# Patient Record
Sex: Female | Born: 1970
Health system: Southern US, Community
[De-identification: ages and names within clinical notes are randomized; demographics above are authoritative.]

## PROBLEM LIST (undated history)

## (undated) ENCOUNTER — Inpatient Hospital Stay (HOSPITAL_COMMUNITY): Payer: Self-pay

## (undated) DIAGNOSIS — B009 Herpesviral infection, unspecified: Secondary | ICD-10-CM

## (undated) DIAGNOSIS — K811 Chronic cholecystitis: Secondary | ICD-10-CM

## (undated) DIAGNOSIS — H15009 Unspecified scleritis, unspecified eye: Secondary | ICD-10-CM

## (undated) DIAGNOSIS — H209 Unspecified iridocyclitis: Secondary | ICD-10-CM

## (undated) DIAGNOSIS — H40009 Preglaucoma, unspecified, unspecified eye: Secondary | ICD-10-CM

## (undated) DIAGNOSIS — K219 Gastro-esophageal reflux disease without esophagitis: Secondary | ICD-10-CM

## (undated) DIAGNOSIS — O039 Complete or unspecified spontaneous abortion without complication: Secondary | ICD-10-CM

## (undated) HISTORY — DX: Unspecified iridocyclitis: H20.9

## (undated) HISTORY — PX: ANAL FISSURE REPAIR: SHX2312

## (undated) HISTORY — DX: Preglaucoma, unspecified, unspecified eye: H40.009

## (undated) HISTORY — DX: Unspecified scleritis, unspecified eye: H15.009

## (undated) HISTORY — DX: Complete or unspecified spontaneous abortion without complication: O03.9

---

## 2011-02-02 ENCOUNTER — Ambulatory Visit
Admission: RE | Admit: 2011-02-02 | Discharge: 2011-02-02 | Disposition: A | Discharge status: Home / Self Care | Payer: BC Managed Care – PPO | Source: Ambulatory Visit | Attending: Rheumatology | Admitting: Rheumatology

## 2011-02-02 ENCOUNTER — Other Ambulatory Visit: Payer: Self-pay | Admitting: Rheumatology

## 2011-02-02 DIAGNOSIS — M545 Low back pain, unspecified: Secondary | ICD-10-CM

## 2011-10-04 ENCOUNTER — Emergency Department (HOSPITAL_COMMUNITY)
Admission: EM | Admit: 2011-10-04 | Discharge: 2011-10-04 | Disposition: A | Discharge status: Home / Self Care | Payer: BC Managed Care – PPO | Attending: Emergency Medicine | Admitting: Emergency Medicine

## 2011-10-04 ENCOUNTER — Emergency Department (HOSPITAL_COMMUNITY): Payer: BC Managed Care – PPO

## 2011-10-04 DIAGNOSIS — K5909 Other constipation: Secondary | ICD-10-CM | POA: Insufficient documentation

## 2011-10-04 DIAGNOSIS — R109 Unspecified abdominal pain: Secondary | ICD-10-CM | POA: Insufficient documentation

## 2011-10-04 DIAGNOSIS — R10819 Abdominal tenderness, unspecified site: Secondary | ICD-10-CM | POA: Insufficient documentation

## 2011-10-04 LAB — CBC
HCT: 35.8 % — ABNORMAL LOW (ref 36.0–46.0)
Hemoglobin: 12.1 g/dL (ref 12.0–15.0)
MCH: 29.9 pg (ref 26.0–34.0)
MCHC: 33.8 g/dL (ref 30.0–36.0)
MCV: 88.4 fL (ref 78.0–100.0)
Platelets: 255 10*3/uL (ref 150–400)
RBC: 4.05 MIL/uL (ref 3.87–5.11)
RDW: 12.9 % (ref 11.5–15.5)
WBC: 7.7 10*3/uL (ref 4.0–10.5)

## 2011-10-04 LAB — URINALYSIS, ROUTINE W REFLEX MICROSCOPIC
Bilirubin Urine: NEGATIVE
Glucose, UA: NEGATIVE mg/dL
Ketones, ur: NEGATIVE mg/dL
Leukocytes, UA: NEGATIVE
Nitrite: NEGATIVE
Protein, ur: NEGATIVE mg/dL
Specific Gravity, Urine: 1.029 (ref 1.005–1.030)
Urobilinogen, UA: 0.2 mg/dL (ref 0.0–1.0)
pH: 5.5 (ref 5.0–8.0)

## 2011-10-04 LAB — DIFFERENTIAL
Basophils Absolute: 0 10*3/uL (ref 0.0–0.1)
Basophils Relative: 0 % (ref 0–1)
Eosinophils Absolute: 0.2 10*3/uL (ref 0.0–0.7)
Eosinophils Relative: 2 % (ref 0–5)
Lymphocytes Relative: 32 % (ref 12–46)
Lymphs Abs: 2.5 10*3/uL (ref 0.7–4.0)
Monocytes Absolute: 0.6 10*3/uL (ref 0.1–1.0)
Monocytes Relative: 8 % (ref 3–12)
Neutro Abs: 4.5 10*3/uL (ref 1.7–7.7)
Neutrophils Relative %: 58 % (ref 43–77)

## 2011-10-04 LAB — POCT PREGNANCY, URINE: Preg Test, Ur: NEGATIVE

## 2011-10-04 LAB — URINE MICROSCOPIC-ADD ON

## 2011-10-04 LAB — LIPASE, BLOOD: Lipase: 29 U/L (ref 11–59)

## 2011-10-04 LAB — COMPREHENSIVE METABOLIC PANEL
ALT: 11 U/L (ref 0–35)
AST: 14 U/L (ref 0–37)
Albumin: 3.9 g/dL (ref 3.5–5.2)
Alkaline Phosphatase: 51 U/L (ref 39–117)
BUN: 20 mg/dL (ref 6–23)
CO2: 24 mEq/L (ref 19–32)
Calcium: 9.3 mg/dL (ref 8.4–10.5)
Chloride: 104 mEq/L (ref 96–112)
Creatinine, Ser: 0.73 mg/dL (ref 0.50–1.10)
GFR calc Af Amer: >90 mL/min (ref >90)
GFR calc non Af Amer: >90 mL/min (ref >90)
Glucose, Bld: 95 mg/dL (ref 70–99)
Potassium: 3.7 mEq/L (ref 3.5–5.1)
Sodium: 139 mEq/L (ref 135–145)
Total Bilirubin: 0.4 mg/dL (ref 0.3–1.2)
Total Protein: 7.5 g/dL (ref 6.0–8.3)

## 2011-10-04 MED ORDER — ONDANSETRON HCL 4 MG/2ML IJ SOLN
4.0000 mg | Freq: Once | INTRAMUSCULAR | Status: AC
  Administered 2011-10-04: 4 mg via INTRAMUSCULAR
  Filled 2011-10-04: qty 2

## 2011-10-04 MED ORDER — OXYCODONE-ACETAMINOPHEN 5-325 MG PO TABS: 1.0000 | ORAL_TABLET | ORAL | Status: AC | PRN

## 2011-10-04 MED ORDER — SODIUM CHLORIDE 0.9 % IV SOLN
Freq: Once | INTRAVENOUS | Status: AC
  Administered 2011-10-04: 08:00:00 via INTRAVENOUS

## 2011-10-04 MED ORDER — METOCLOPRAMIDE HCL 10 MG PO TABS
10.0000 mg | ORAL_TABLET | Freq: Four times a day (QID) | ORAL | Status: AC | PRN
Start: 2011-10-04 — End: 2011-10-14

## 2011-10-04 MED ORDER — HYDROMORPHONE HCL PF 1 MG/ML IJ SOLN
1.0000 mg | Freq: Once | INTRAMUSCULAR | Status: AC
  Administered 2011-10-04: 1 mg via INTRAVENOUS
  Filled 2011-10-04: qty 1

## 2011-10-04 NOTE — ED Notes (Signed)
Report received, assumed care.  

## 2011-10-04 NOTE — ED Provider Notes (Addendum)
History     CSN: [id number] Arrival date & time: 10/04/2011  7:08 AM   First MD Initiated Contact with Patient 10/04/11 [phone]      Chief Complaint  Patient presents with  . Abdominal Pain    (Consider location/radiation/quality/duration/timing/severity/associated sxs/prior treatment) The history is provided by the patient.   40 year old female had onset last night of pain which started in her upper lumbar area bilaterally and then settled into her right flank area radiating around to the right upper quadrant. Pain is worse when she bends over but nothing makes it better. Pain is severe and comes in waves.she describes the pain as sharp. The pain is 5/10 currently but 10 out of 10 at its worst. She denies nausea or vomiting and denies diarrhea. She has chronic problems with constipation. She denies any urinary symptoms. There has been no fever and no chills or sweats. She states that 2 years ago she had an ultrasound which showed gallstones but she has not had any abdominal pain since then.she states she has never been pregnant. No past medical history on file.  No past surgical history on file.  No family history on file.  History  Substance Use Topics  . Smoking status: Never Smoker   . Smokeless tobacco: Not on file  . Alcohol Use: No    OB History    Grav Para Term Preterm Abortions TAB SAB Ect Mult Living                  Review of Systems  All other systems reviewed and are negative.    Allergies  Review of patient's allergies indicates no known allergies.  Home Medications  No current outpatient prescriptions on file.  BP 115/78  Pulse 88  Temp(Src) 97.9 F (36.6 C) (Oral)  Resp 18  Ht 5\' 2"  (1.575 m)  Wt 147 lb (66.679 kg)  BMI 26.89 kg/m2  SpO2 98%  LMP 10/03/2011  Physical Exam  Nursing note and vitals reviewed.  40 year old female who appears to be in pain. Vital signs are normal. Head is normocephalic and atraumatic. PERRLA, EOMI. The sclerae  are anicteric. Oropharynx is clear. Neck is supple without adenopathy or JVD. Back is nontender along the spine, but there is mild right CVA tenderness. Lungs are clear without any rales, wheezes, rhonchi. Heart has regular rate and rhythm without murmur. Abdomen is soft with mild tenderness in epigastrium and marked tenderness in the right upper quadrant with a positive Murphy sign. There is no hepatosplenomegaly. Peristalsis is diminished. Extremities have full range of motion, no cyanosis or edema. Skin is warm and moist without rash. Neurologic: Mental status is normal, cranial nerves are intact. There are no motor or sensory deficits. Psychiatric: No abnormalities of mood or affect. ED Course  Procedures (including critical care time)   Labs Reviewed  CBC  DIFFERENTIAL  COMPREHENSIVE METABOLIC PANEL  LIPASE, BLOOD  URINALYSIS, ROUTINE W REFLEX MICROSCOPIC  POCT PREGNANCY, URINE   No results found.  Results for orders placed during the hospital encounter of 10/04/11  CBC      Component Value Range   WBC 7.7  4.0 - 10.5 (K/uL)   RBC 4.05  3.87 - 5.11 (MIL/uL)   Hemoglobin 12.1  12.0 - 15.0 (g/dL)   HCT 59.5 (*) 63.8 - 46.0 (%)   MCV 88.4  78.0 - 100.0 (fL)   MCH 29.9  26.0 - 34.0 (pg)   MCHC 33.8  30.0 - 36.0 (g/dL)   RDW 12.9  11.5 - 15.5 (%)   Platelets 255  150 - 400 (K/uL)  DIFFERENTIAL      Component Value Range   Neutrophils Relative 58  43 - 77 (%)   Neutro Abs 4.5  1.7 - 7.7 (K/uL)   Lymphocytes Relative 32  12 - 46 (%)   Lymphs Abs 2.5  0.7 - 4.0 (K/uL)   Monocytes Relative 8  3 - 12 (%)   Monocytes Absolute 0.6  0.1 - 1.0 (K/uL)   Eosinophils Relative 2  0 - 5 (%)   Eosinophils Absolute 0.2  0.0 - 0.7 (K/uL)   Basophils Relative 0  0 - 1 (%)   Basophils Absolute 0.0  0.0 - 0.1 (K/uL)  COMPREHENSIVE METABOLIC PANEL      Component Value Range   Sodium 139  135 - 145 (mEq/L)   Potassium 3.7  3.5 - 5.1 (mEq/L)   Chloride 104  96 - 112 (mEq/L)   CO2 24  19 - 32  (mEq/L)   Glucose, Bld 95  70 - 99 (mg/dL)   BUN 20  6 - 23 (mg/dL)   Creatinine, Ser 4.09  0.50 - 1.10 (mg/dL)   Calcium 9.3  8.4 - 81.1 (mg/dL)   Total Protein 7.5  6.0 - 8.3 (g/dL)   Albumin 3.9  3.5 - 5.2 (g/dL)   AST 14  0 - 37 (U/L)   ALT 11  0 - 35 (U/L)   Alkaline Phosphatase 51  39 - 117 (U/L)   Total Bilirubin 0.4  0.3 - 1.2 (mg/dL)   GFR calc non Af Amer >90  >90 (mL/min)   GFR calc Af Amer >90  >90 (mL/min)  LIPASE, BLOOD      Component Value Range   Lipase 29  11 - 59 (U/L)  URINALYSIS, ROUTINE W REFLEX MICROSCOPIC      Component Value Range   Color, Urine YELLOW  YELLOW    APPearance CLOUDY (*) CLEAR    Specific Gravity, Urine 1.029  1.005 - 1.030    pH 5.5  5.0 - 8.0    Glucose, UA NEGATIVE  NEGATIVE (mg/dL)   Hgb urine dipstick LARGE (*) NEGATIVE    Bilirubin Urine NEGATIVE  NEGATIVE    Ketones, ur NEGATIVE  NEGATIVE (mg/dL)   Protein, ur NEGATIVE  NEGATIVE (mg/dL)   Urobilinogen, UA 0.2  0.0 - 1.0 (mg/dL)   Nitrite NEGATIVE  NEGATIVE    Leukocytes, UA NEGATIVE  NEGATIVE   POCT PREGNANCY, URINE      Component Value Range   Preg Test, Ur NEGATIVE    URINE MICROSCOPIC-ADD ON      Component Value Range   Squamous Epithelial / LPF FEW (*) RARE    WBC, UA 0-2  <3 (WBC/hpf)   RBC / HPF 7-10  <3 (RBC/hpf)   Bacteria, UA RARE  RARE    Crystals CA OXALATE CRYSTALS (*) NEGATIVE    US Abdomen Complete  10/04/2011  *RADIOLOGY REPORT*  Clinical Data:  Abdominal pain.  History of gallstones.  COMPLETE ABDOMINAL ULTRASOUND  Comparison:  None.  Findings:  Gallbladder:  The gallbladder is normal in appearance, without wall thickening or pericholecystic fluid.  The technologist describes tenderness with gallbladder palpation.  Common bile duct: Normal, 5 mm.  Liver: Normal in echogenicity, without focal lesion.  IVC: Negative  Pancreas:  Negative  Spleen:  Normal in size and echogenicity.  Right Kidney:  12.0 cm. No hydronephrosis.  Left Kidney:  11.6 cm. No hydronephrosis.  Abdominal aorta:  Nonaneurysmal without ascites.  IMPRESSION:  1.  The technologist describes tenderness with gallbladder palpation.  The gallbladder is normal in appearance, arguing against acute cholecystitis. 2.  Otherwise normal abdominal ultrasound.  Original Report Authenticated By: Consuello Bossier, M.D.     No diagnosis found. She was given Dilaudid and Zofran with excellent relief of pain. Workup is essentially negative although there is a mention on ultrasound report that pain was elicited when the probe pressed against the gallbladder. I discussed with the patient the findings. The next up and evaluation would be a HIDA scan, which can be obtained as an outpatient.  Case has been discussed with [facility] who will see her for outpatient evaluation.  MDM  Right upper quadrant pain-most likely biliary colic.        Dione Booze, MD 10/04/11 1610  Dione Booze, MD 10/04/11 [phone]

## 2011-10-04 NOTE — ED Notes (Signed)
Patient transported to Ultrasound 

## 2011-10-04 NOTE — ED Notes (Signed)
Returned from Ultrasound. Tolerated well. Informed patient and/or family of status.

## 2011-10-06 ENCOUNTER — Encounter: Payer: Self-pay | Admitting: Internal Medicine

## 2011-10-19 NOTE — L&D Delivery Note (Signed)
Delivery Note At 8:30 PM a healthy female was delivered via Vaginal, Spontaneous Delivery (Presentation:  LOA).  APGAR: 7, 9; weight 9 lb 4.3 oz (4205 g).   Placenta status: Intact, Spontaneous.  Cord: 3 vessels with the following complications: .  Cord pH: Not done  Anesthesia: Epidural  Episiotomy: None Lacerations: 2nd degree Suture Repair: vicryl rapide Est. Blood Loss (mL): 350  Mom to postpartum.  Baby to nursery-stable.  Renee [name],MARIE-LYNE 08/09/2012, 9:27 PM

## 2011-10-29 ENCOUNTER — Ambulatory Visit: Payer: BC Managed Care – PPO | Admitting: Internal Medicine

## 2012-01-04 LAB — OB RESULTS CONSOLE ABO/RH: RH Type: POSITIVE

## 2012-01-04 LAB — OB RESULTS CONSOLE HIV ANTIBODY (ROUTINE TESTING): HIV: NONREACTIVE

## 2012-01-04 LAB — OB RESULTS CONSOLE HEPATITIS B SURFACE ANTIGEN: Hepatitis B Surface Ag: NEGATIVE

## 2012-01-04 LAB — OB RESULTS CONSOLE RPR: RPR: NONREACTIVE

## 2012-01-04 LAB — OB RESULTS CONSOLE GC/CHLAMYDIA
Chlamydia: NEGATIVE
Gonorrhea: NEGATIVE

## 2012-01-04 LAB — OB RESULTS CONSOLE ANTIBODY SCREEN: Antibody Screen: NEGATIVE

## 2012-01-04 LAB — OB RESULTS CONSOLE RUBELLA ANTIBODY, IGM: Rubella: IMMUNE

## 2012-06-26 LAB — OB RESULTS CONSOLE GBS: GBS: POSITIVE

## 2012-07-29 ENCOUNTER — Inpatient Hospital Stay (HOSPITAL_COMMUNITY)
Admission: AD | Admit: 2012-07-29 | Discharge: 2012-07-29 | Disposition: A | Discharge status: Home / Self Care | Payer: BC Managed Care – PPO | Source: Ambulatory Visit | Attending: Obstetrics & Gynecology | Admitting: Obstetrics & Gynecology

## 2012-07-29 ENCOUNTER — Encounter (HOSPITAL_COMMUNITY): Payer: Self-pay | Admitting: *Deleted

## 2012-07-29 DIAGNOSIS — O99891 Other specified diseases and conditions complicating pregnancy: Secondary | ICD-10-CM | POA: Insufficient documentation

## 2012-07-29 HISTORY — DX: Herpesviral infection, unspecified: B00.9

## 2012-07-29 LAB — AMNISURE RUPTURE OF MEMBRANE (ROM) NOT AT ARMC: Amnisure ROM: NEGATIVE

## 2012-07-29 LAB — POCT FERN TEST: Fern Test: NEGATIVE

## 2012-07-29 NOTE — MAU Note (Signed)
Leaked a small amount of fluid around 2am but not leaking currently. No contractions

## 2012-08-02 ENCOUNTER — Telehealth (HOSPITAL_COMMUNITY): Payer: Self-pay | Admitting: *Deleted

## 2012-08-02 ENCOUNTER — Encounter (HOSPITAL_COMMUNITY): Payer: Self-pay | Admitting: *Deleted

## 2012-08-02 NOTE — Telephone Encounter (Signed)
Preadmission screen  

## 2012-08-04 ENCOUNTER — Other Ambulatory Visit: Payer: Self-pay | Admitting: Obstetrics & Gynecology

## 2012-08-08 ENCOUNTER — Other Ambulatory Visit: Payer: Self-pay | Admitting: Obstetrics & Gynecology

## 2012-08-08 ENCOUNTER — Encounter (HOSPITAL_COMMUNITY): Payer: Self-pay

## 2012-08-08 ENCOUNTER — Inpatient Hospital Stay (HOSPITAL_COMMUNITY)
Admission: RE | Admit: 2012-08-08 | Discharge: 2012-08-11 | DRG: 373 | Disposition: A | Discharge status: Home / Self Care | Payer: BC Managed Care – PPO | Source: Ambulatory Visit | Attending: Obstetrics | Admitting: Obstetrics

## 2012-08-08 ENCOUNTER — Telehealth (HOSPITAL_COMMUNITY): Payer: Self-pay | Admitting: *Deleted

## 2012-08-08 DIAGNOSIS — O09529 Supervision of elderly multigravida, unspecified trimester: Secondary | ICD-10-CM | POA: Diagnosis present

## 2012-08-08 DIAGNOSIS — O480 Post-term pregnancy: Principal | ICD-10-CM | POA: Diagnosis present

## 2012-08-08 DIAGNOSIS — O358XX0 Maternal care for other (suspected) fetal abnormality and damage, not applicable or unspecified: Secondary | ICD-10-CM | POA: Diagnosis present

## 2012-08-08 LAB — CBC
HCT: 36.2 % (ref 36.0–46.0)
Hemoglobin: 12.1 g/dL (ref 12.0–15.0)
MCH: 31 pg (ref 26.0–34.0)
MCHC: 33.4 g/dL (ref 30.0–36.0)
MCV: 92.8 fL (ref 78.0–100.0)
Platelets: 304 10*3/uL (ref 150–400)
RBC: 3.9 MIL/uL (ref 3.87–5.11)
RDW: 14.6 % (ref 11.5–15.5)
WBC: 10.7 10*3/uL — ABNORMAL HIGH (ref 4.0–10.5)

## 2012-08-08 MED ORDER — OXYTOCIN 40 UNITS IN LACTATED RINGERS INFUSION - SIMPLE MED: 62.5000 mL/h | INTRAVENOUS | Status: DC

## 2012-08-08 MED ORDER — TERBUTALINE SULFATE 1 MG/ML IJ SOLN: 0.2500 mg | Freq: Once | INTRAMUSCULAR | Status: AC | PRN

## 2012-08-08 MED ORDER — ZOLPIDEM TARTRATE 5 MG PO TABS
5.0000 mg | ORAL_TABLET | Freq: Every evening | ORAL | Status: DC | PRN
  Administered 2012-08-08: 5 mg via ORAL
  Filled 2012-08-08: qty 1

## 2012-08-08 MED ORDER — ACETAMINOPHEN 325 MG PO TABS: 650.0000 mg | ORAL_TABLET | ORAL | Status: DC | PRN

## 2012-08-08 MED ORDER — IBUPROFEN 600 MG PO TABS: 600.0000 mg | ORAL_TABLET | Freq: Four times a day (QID) | ORAL | Status: DC | PRN

## 2012-08-08 MED ORDER — OXYTOCIN 40 UNITS IN LACTATED RINGERS INFUSION - SIMPLE MED
1.0000 m[IU]/min | INTRAVENOUS | Status: DC
  Administered 2012-08-09: 1 m[IU]/min via INTRAVENOUS
  Filled 2012-08-08: qty 1000

## 2012-08-08 MED ORDER — ONDANSETRON HCL 4 MG/2ML IJ SOLN: 4.0000 mg | Freq: Four times a day (QID) | INTRAMUSCULAR | Status: DC | PRN

## 2012-08-08 MED ORDER — MISOPROSTOL 25 MCG QUARTER TABLET
25.0000 ug | ORAL_TABLET | ORAL | Status: DC | PRN
  Administered 2012-08-08 – 2012-08-09 (×2): 25 ug via VAGINAL
  Filled 2012-08-08 (×2): qty 0.25

## 2012-08-08 MED ORDER — LACTATED RINGERS IV SOLN
500.0000 mL | INTRAVENOUS | Status: DC | PRN
  Administered 2012-08-09: 1000 mL via INTRAVENOUS
  Administered 2012-08-09 (×2): 500 mL via INTRAVENOUS

## 2012-08-08 MED ORDER — PENICILLIN G POTASSIUM 5000000 UNITS IJ SOLR
5.0000 10*6.[IU] | Freq: Once | INTRAVENOUS | Status: AC
  Administered 2012-08-09: 5 10*6.[IU] via INTRAVENOUS
  Filled 2012-08-08: qty 5

## 2012-08-08 MED ORDER — LACTATED RINGERS IV SOLN
INTRAVENOUS | Status: DC
  Administered 2012-08-08: 21:00:00 via INTRAVENOUS

## 2012-08-08 MED ORDER — OXYTOCIN BOLUS FROM INFUSION
500.0000 mL | INTRAVENOUS | Status: DC
  Administered 2012-08-09: 500 mL via INTRAVENOUS
  Filled 2012-08-08 (×83): qty 500

## 2012-08-08 MED ORDER — PENICILLIN G POTASSIUM 5000000 UNITS IJ SOLR
2.5000 10*6.[IU] | INTRAMUSCULAR | Status: DC
  Administered 2012-08-09 (×3): 2.5 10*6.[IU] via INTRAVENOUS
  Filled 2012-08-08 (×5): qty 2.5

## 2012-08-08 MED ORDER — LIDOCAINE HCL (PF) 1 % IJ SOLN
30.0000 mL | INTRAMUSCULAR | Status: DC | PRN
  Administered 2012-08-09: 30 mL via SUBCUTANEOUS
  Filled 2012-08-08: qty 30

## 2012-08-08 MED ORDER — OXYCODONE-ACETAMINOPHEN 5-325 MG PO TABS: 1.0000 | ORAL_TABLET | ORAL | Status: DC | PRN

## 2012-08-08 MED ORDER — CITRIC ACID-SODIUM CITRATE 334-500 MG/5ML PO SOLN: 30.0000 mL | ORAL | Status: DC | PRN

## 2012-08-08 NOTE — Telephone Encounter (Signed)
Preadmission screen  

## 2012-08-09 ENCOUNTER — Inpatient Hospital Stay (HOSPITAL_COMMUNITY)
Admission: RE | Admit: 2012-08-09 | Payer: BC Managed Care – PPO | Source: Ambulatory Visit | Admitting: Obstetrics & Gynecology

## 2012-08-09 ENCOUNTER — Encounter (HOSPITAL_COMMUNITY): Payer: Self-pay

## 2012-08-09 ENCOUNTER — Encounter (HOSPITAL_COMMUNITY): Payer: Self-pay | Admitting: Anesthesiology

## 2012-08-09 ENCOUNTER — Inpatient Hospital Stay (HOSPITAL_COMMUNITY): Payer: BC Managed Care – PPO | Admitting: Anesthesiology

## 2012-08-09 LAB — RPR: RPR Ser Ql: NONREACTIVE

## 2012-08-09 LAB — PREPARE RBC (CROSSMATCH)

## 2012-08-09 LAB — ABO/RH: ABO/RH(D): AB POS

## 2012-08-09 MED ORDER — FENTANYL 2.5 MCG/ML BUPIVACAINE 1/10 % EPIDURAL INFUSION (WH - ANES)
INTRAMUSCULAR | Status: DC | PRN
  Administered 2012-08-09: 14 mL/h via EPIDURAL

## 2012-08-09 MED ORDER — PHENYLEPHRINE 40 MCG/ML (10ML) SYRINGE FOR IV PUSH (FOR BLOOD PRESSURE SUPPORT)
80.0000 ug | PREFILLED_SYRINGE | INTRAVENOUS | Status: DC | PRN
  Filled 2012-08-09: qty 5

## 2012-08-09 MED ORDER — SODIUM BICARBONATE 8.4 % IV SOLN
INTRAVENOUS | Status: DC | PRN
  Administered 2012-08-09: 5 mL via EPIDURAL

## 2012-08-09 MED ORDER — EPHEDRINE 5 MG/ML INJ
10.0000 mg | INTRAVENOUS | Status: DC | PRN
  Administered 2012-08-09: 10 mg via INTRAVENOUS
  Filled 2012-08-09: qty 4

## 2012-08-09 MED ORDER — ONDANSETRON HCL 4 MG PO TABS: 4.0000 mg | ORAL_TABLET | ORAL | Status: DC | PRN

## 2012-08-09 MED ORDER — EPHEDRINE 5 MG/ML INJ: 10.0000 mg | INTRAVENOUS | Status: DC | PRN

## 2012-08-09 MED ORDER — SENNOSIDES-DOCUSATE SODIUM 8.6-50 MG PO TABS
2.0000 | ORAL_TABLET | Freq: Every day | ORAL | Status: DC
  Administered 2012-08-09 – 2012-08-10 (×2): 2 via ORAL

## 2012-08-09 MED ORDER — DIPHENHYDRAMINE HCL 25 MG PO CAPS: 25.0000 mg | ORAL_CAPSULE | Freq: Four times a day (QID) | ORAL | Status: DC | PRN

## 2012-08-09 MED ORDER — LANOLIN HYDROUS EX OINT: TOPICAL_OINTMENT | CUTANEOUS | Status: DC | PRN

## 2012-08-09 MED ORDER — PHENYLEPHRINE 40 MCG/ML (10ML) SYRINGE FOR IV PUSH (FOR BLOOD PRESSURE SUPPORT): 80.0000 ug | PREFILLED_SYRINGE | INTRAVENOUS | Status: DC | PRN

## 2012-08-09 MED ORDER — WITCH HAZEL-GLYCERIN EX PADS: 1.0000 "application " | MEDICATED_PAD | CUTANEOUS | Status: DC | PRN

## 2012-08-09 MED ORDER — ZOLPIDEM TARTRATE 5 MG PO TABS: 5.0000 mg | ORAL_TABLET | Freq: Every evening | ORAL | Status: DC | PRN

## 2012-08-09 MED ORDER — PRENATAL MULTIVITAMIN CH
1.0000 | ORAL_TABLET | Freq: Every day | ORAL | Status: DC
  Administered 2012-08-10: 1 via ORAL
  Filled 2012-08-09 (×3): qty 1

## 2012-08-09 MED ORDER — FENTANYL 2.5 MCG/ML BUPIVACAINE 1/10 % EPIDURAL INFUSION (WH - ANES)
14.0000 mL/h | INTRAMUSCULAR | Status: DC
  Administered 2012-08-09: 14 mL/h via EPIDURAL
  Filled 2012-08-09 (×2): qty 125

## 2012-08-09 MED ORDER — BENZOCAINE-MENTHOL 20-0.5 % EX AERO
1.0000 "application " | INHALATION_SPRAY | CUTANEOUS | Status: DC | PRN
  Administered 2012-08-09: 1 via TOPICAL
  Filled 2012-08-09 (×2): qty 56

## 2012-08-09 MED ORDER — CITRIC ACID-SODIUM CITRATE 334-500 MG/5ML PO SOLN
ORAL | Status: AC
  Filled 2012-08-09: qty 15

## 2012-08-09 MED ORDER — TETANUS-DIPHTH-ACELL PERTUSSIS 5-2.5-18.5 LF-MCG/0.5 IM SUSP
0.5000 mL | Freq: Once | INTRAMUSCULAR | Status: AC
  Administered 2012-08-10: 0.5 mL via INTRAMUSCULAR
  Filled 2012-08-09: qty 0.5

## 2012-08-09 MED ORDER — DIBUCAINE 1 % RE OINT: 1.0000 "application " | TOPICAL_OINTMENT | RECTAL | Status: DC | PRN

## 2012-08-09 MED ORDER — ONDANSETRON HCL 4 MG/2ML IJ SOLN: 4.0000 mg | INTRAMUSCULAR | Status: DC | PRN

## 2012-08-09 MED ORDER — LACTATED RINGERS IV SOLN
500.0000 mL | Freq: Once | INTRAVENOUS | Status: AC
  Administered 2012-08-09: 1000 mL via INTRAVENOUS

## 2012-08-09 MED ORDER — DIPHENHYDRAMINE HCL 50 MG/ML IJ SOLN: 12.5000 mg | INTRAMUSCULAR | Status: DC | PRN

## 2012-08-09 MED ORDER — IBUPROFEN 600 MG PO TABS
600.0000 mg | ORAL_TABLET | Freq: Four times a day (QID) | ORAL | Status: DC
  Administered 2012-08-09 – 2012-08-11 (×7): 600 mg via ORAL
  Filled 2012-08-09 (×8): qty 1

## 2012-08-09 MED ORDER — OXYCODONE-ACETAMINOPHEN 5-325 MG PO TABS: 1.0000 | ORAL_TABLET | ORAL | Status: DC | PRN

## 2012-08-09 MED ORDER — SIMETHICONE 80 MG PO CHEW: 80.0000 mg | CHEWABLE_TABLET | ORAL | Status: DC | PRN

## 2012-08-09 NOTE — Anesthesia Procedure Notes (Signed)

## 2012-08-09 NOTE — Progress Notes (Signed)
Subjective: Doing well, pain vaginally, UCs MVU 180-200  Anesthesia epidural   Objective: BP 105/60  Pulse 106  Temp 98.2 F (36.8 C) (Oral)  Resp 18  Ht 5\' 3"  (1.6 m)  Wt 93.895 kg (207 lb)  BMI 36.67 kg/m2  SpO2 99%  LMP 10/25/2011   FHT:  FHR: 155 bpm, variability: moderate,  accelerations:  Present,  decelerations:  Present mild variable decelerations occasionally. UC:   regular, every 3 minutes VE:   Dilation: 9 Effacement (%): 100 Station: 0 Exam by:: Raistlin Gum   Assessment / Plan: Induction of labor for postterm.  Slow progression, possible FPD.  Adequate MVU x about 1 hr.  Following closely.  Possibility of requiring a C/S delivery discussed.  Fetal Wellbeing:  Category I Pain Control:  Epidural  Anticipated MOD:  NSVD or C/S per next evaluation  Britton Perkinson,MARIE-LYNE 08/09/2012, 6:30 PM

## 2012-08-09 NOTE — Progress Notes (Signed)
Subjective: Doing well, pain/pressure vaginally, UCs q3 min  Anesthesia epidural   Objective: BP 98/69  Pulse 112  Temp 98.5 F (36.9 C) (Oral)  Resp 16  Ht 5\' 3"  (1.6 m)  Wt 93.895 kg (207 lb)  BMI 36.67 kg/m2  SpO2 99%  LMP 10/25/2011   FHT:  FHR: 145 bpm, variability: moderate,  accelerations:  Present,  decelerations:  Present Early non repetitive decelerations UC:   regular, every 3 minutes VE:   Dilation: 8 Effacement (%): 100 Station: 0 Exam by:: Piya Mesch   Assessment / Plan: Induction of labor due to postterm,  progressing well on pitocin  Fetal Wellbeing:  Category I Pain Control:  Epidural  Anticipated MOD:  NSVD  Nadyne Gariepy,MARIE-LYNE 08/09/2012, 1:25 PM

## 2012-08-09 NOTE — Anesthesia Preprocedure Evaluation (Signed)
Anesthesia Evaluation  Patient identified by MRN, date of birth, ID band Patient awake    Reviewed: Allergy & Precautions, H&P , Patient's Chart, lab work & pertinent test results  Airway Mallampati: II TM Distance: >3 FB Neck ROM: full    Dental  (+) Teeth Intact   Pulmonary  breath sounds clear to auscultation        Cardiovascular Rhythm:regular Rate:Normal     Neuro/Psych    GI/Hepatic   Endo/Other    Renal/GU      Musculoskeletal   Abdominal   Peds  Hematology   Anesthesia Other Findings       Reproductive/Obstetrics (+) Pregnancy                           Anesthesia Physical Anesthesia Plan  ASA: III  Anesthesia Plan: Epidural   Post-op Pain Management:    Induction:   Airway Management Planned:   Additional Equipment:   Intra-op Plan:   Post-operative Plan:   Informed Consent:   Plan Discussed with:   Anesthesia Plan Comments:         Anesthesia Quick Evaluation

## 2012-08-09 NOTE — H&P (Signed)
Subjective:  Renee [name] is a 41 y.o. G1 P0 female at 29 and 2/[redacted] weeks gestation who is being admitted for induction of labor.  Her current obstetrical history is significant for GBS colonizer.  Patient reports no complaints.   Fetal Movement: normal.     Objective:   Vital signs in last 24 hours: Temp:  [97.3 F (36.3 C)-98.5 F (36.9 C)] 98.5 F (36.9 C) (10/23 0821) Pulse Rate:  [80-119] 99  (10/23 1201) Resp:  [16-20] 18  (10/23 1201) BP: (75-129)/(32-81) 118/78 mmHg (10/23 1201) SpO2:  [96 %-99 %] 99 % (10/23 0822) Weight:  [93.895 kg (207 lb)] 93.895 kg (207 lb) (10/22 2010)   General:   alert  Skin:   normal  HEENT:  PERRLA  Lungs:   clear to auscultation bilaterally  Heart:   regular rate and rhythm, S1, S2 normal, no murmur, click, rub or gallop  Breasts:   normal without suspicious masses, skin or nipple changes or axillary nodes  Abdomen:  soft, non-tender; bowel sounds normal; no masses,  no organomegaly  Pelvis:  Exam deferred.  FHT:  140's BPM  Uterine Size: size greater than dates  Presentations: cephalic  Cervix:    Dilation: 3cm+   Effacement: 90%   Station:  -2   Consistency: soft   Position: anterior                                                             AROM fluid meco tinted AF                               UCs q29min well under epidural.  FHR good variability with internal monitoring.  Non repetitive early decelerations, rarely lates.  Lab Review  Rh+  Ultrascreen wnl.  AFP1 wnl.  [place] anato wnl.  Last [place] EFW 88%, AFI wnl.  One hour GTT: Normal    Assessment/Plan:  41 and 2/[redacted] weeks gestation. Active phase labor after Cytotec/Pitocin/AROM induction.  Fetal well-being reassuring. Obstetrical history significant for GBS pos and post term    Risks, benefits, alternatives and possible complications have been discussed in detail with the patient.  Pre-admission, admission, and post admission procedures and expectations were discussed in detail.  All  questions answered, all appropriate consents will be signed at the Hospital. Admission is planned for today.  Continue present management.

## 2012-08-10 LAB — CBC
HCT: 33.8 % — ABNORMAL LOW (ref 36.0–46.0)
Hemoglobin: 11.2 g/dL — ABNORMAL LOW (ref 12.0–15.0)
MCH: 30.9 pg (ref 26.0–34.0)
MCHC: 33.1 g/dL (ref 30.0–36.0)
MCV: 93.1 fL (ref 78.0–100.0)
Platelets: 288 10*3/uL (ref 150–400)
RBC: 3.63 MIL/uL — ABNORMAL LOW (ref 3.87–5.11)
RDW: 14.9 % (ref 11.5–15.5)
WBC: 22.4 10*3/uL — ABNORMAL HIGH (ref 4.0–10.5)

## 2012-08-10 NOTE — Progress Notes (Signed)
PPD 1 SVD  S:  Reports feeling well             Tolerating po/ No nausea or vomiting             Bleeding is light             Pain controlled withibuprofen (OTC)             Up ad lib / ambulatory  Newborn breast-feeding  / newborn with fractured clavicle   O:               VS: BP 97/62  Pulse 87  Temp 97.5 F (36.4 C) (Oral)  Resp 16  Ht 5\' 3"  (1.6 m)  Wt 93.895 kg (207 lb)  BMI 36.67 kg/m2  SpO2 99%  LMP 10/25/2011  Breastfeeding? Unknown   LABS:  Basename 08/10/12 0540 08/08/12 2040  WBC 22.4* 10.7*  HGB 11.2* 12.1  PLT 288 304                                       Physical Exam:             Alert and oriented X3  Lungs: Clear and unlabored  Heart: regular rate and rhythm / no mumurs  Abdomen: soft, non-tender, non-distended              Fundus: firm, non-tender, U-1  Perineum: mild edema / ice pack in place  Lochia: light  Extremities: trace edema, no calf pain or tenderness    A: PPD # 1   Doing well - stable status             Neonatal clavicle fracture    P:  Routine post partum orders  discharge tomorrow   Answered patient questions about neonatal clavicle fracture - reassured will heal normally and not painful for babies unless arm is abducted laterally- pediatrician will discuss with her isolating arm until healed / no residual effects anticipated / not uncommon with very large babies delivered vaginally .  Marlinda Mike CNM, MSN 08/10/2012, 11:32 AM

## 2012-08-11 LAB — TYPE AND SCREEN
ABO/RH(D): AB POS
Antibody Screen: NEGATIVE
Unit division: 0
Unit division: 0

## 2012-08-11 MED ORDER — DOCUSATE SODIUM 100 MG PO CAPS: 100.0000 mg | ORAL_CAPSULE | Freq: Two times a day (BID) | ORAL | Status: AC | PRN

## 2012-08-11 MED ORDER — OXYCODONE-ACETAMINOPHEN 5-325 MG PO TABS: 1.0000 | ORAL_TABLET | Freq: Four times a day (QID) | ORAL | Status: DC | PRN

## 2012-08-11 MED ORDER — IBUPROFEN 600 MG PO TABS: 600.0000 mg | ORAL_TABLET | Freq: Four times a day (QID) | ORAL | Status: DC

## 2012-08-11 NOTE — Discharge Summary (Signed)
Obstetric Discharge Summary Reason for Admission: induction of labor and gestation @ 61w 2d Prenatal Procedures: ultrasound Intrapartum Procedures: spontaneous vaginal delivery Postpartum Procedures: none Complications-Operative and Postpartum: 2nd degree perineal laceration and Neonatal clavicle fracture Hemoglobin  Date Value Range Status  08/10/2012 11.2* 12.0 - 15.0 g/dL Final     HCT  Date Value Range Status  08/10/2012 33.8* 36.0 - 46.0 % Final    Physical Exam:  General: alert, cooperative and no distress Lochia: appropriate Uterine Fundus: firm Incision: na DVT Evaluation: No evidence of DVT seen on physical exam. Calf/Ankle edema is present. +3 pedal and pretib  Discharge Diagnoses: Term Pregnancy-delivered, w/ 2nd degree repair Neonatal clavicle fracture  Discharge Information: Date: 08/11/2012 Activity: pelvic rest Diet: routine Medications: PNV, Ibuprofen, Colace and Percocet Condition: stable Instructions: refer to practice specific booklet Discharge to: home   Newborn Data: Live born female on 08/09/12 Birth Weight: 9 lb 4.3 oz (4205 g) APGAR: 7, 9  Home with mother.  Teigan Manner K 08/11/2012, 9:57 AM

## 2012-08-11 NOTE — Progress Notes (Signed)
Patient ID: Renee [name], female   DOB: 02/06/1971, 41 y.o.   MRN: [id number]  PPD # 2  Subjective: Pt reports feeling well and eager for d/c home.  More comfortable with infant status this am and notes improvement/ Pain controlled with ibuprofen Tolerating po/ Voiding without problems/ No n/v Bleeding is light/ Newborn info:  Information for the patient's newborn:  [name], [name] [[id number]]  female Feeding: breast    Objective:  VS: Blood pressure 96/61, pulse 84, temperature 98 F (36.7 C), temperature source Oral, resp. rate 20.    Basename 08/10/12 0540 08/08/12 2040  WBC 22.4* 10.7*  HGB 11.2* 12.1  HCT 33.8* 36.2  PLT 288 304    Blood type: --/--/AB POS, AB POS (10/22 2040) Rubella: Immune (03/19 0000)    Physical Exam:  General: A & O x 3  alert, cooperative and no distress CV: Regular rate and rhythm Resp: clear Abdomen: soft, nontender, normal bowel sounds Uterine Fundus: firm, below umbilicus, nontender Perineum: not inspected; breast feeding during assmt Lochia: minimal Ext: edema +3 pedal and pretib and Homans sign is negative, no sign of DVT    A/P: PPD # 2/ G1P1001/ S/P: SVD w/ 2nd degree repair Neonatal clavicle fracture, stable Doing well and stable for discharge home RX: Ibuprofen 600mg  po Q 6 hrs prn pain #30 Refill x 1 Percocet 5/325 1 to 2 po Q 4 hrs prn pain #15 No refill WOB/GYN booklet given Routine pp visit in 6wks   Demetrius Revel, MSN, [facility] 08/11/2012, 9:52 AM

## 2012-08-18 ENCOUNTER — Inpatient Hospital Stay (HOSPITAL_COMMUNITY): Admission: AD | Admit: 2012-08-18 | Payer: Self-pay | Source: Ambulatory Visit | Admitting: Obstetrics & Gynecology

## 2012-10-06 ENCOUNTER — Ambulatory Visit
Admission: RE | Admit: 2012-10-06 | Discharge: 2012-10-06 | Disposition: A | Discharge status: Home / Self Care | Payer: 59 | Source: Ambulatory Visit | Attending: Ophthalmology | Admitting: Ophthalmology

## 2012-10-06 ENCOUNTER — Other Ambulatory Visit: Payer: Self-pay | Admitting: Ophthalmology

## 2012-10-06 DIAGNOSIS — H15119 Episcleritis periodica fugax, unspecified eye: Secondary | ICD-10-CM

## 2013-01-08 DIAGNOSIS — IMO0002 Reserved for concepts with insufficient information to code with codable children: Secondary | ICD-10-CM | POA: Insufficient documentation

## 2013-01-16 HISTORY — PX: CATARACT EXTRACTION W/ INTRAOCULAR LENS IMPLANT: SHX1309

## 2013-02-02 DIAGNOSIS — Z9849 Cataract extraction status, unspecified eye: Secondary | ICD-10-CM | POA: Insufficient documentation

## 2013-08-15 ENCOUNTER — Encounter (HOSPITAL_BASED_OUTPATIENT_CLINIC_OR_DEPARTMENT_OTHER): Payer: Self-pay | Admitting: *Deleted

## 2013-08-17 ENCOUNTER — Encounter (HOSPITAL_BASED_OUTPATIENT_CLINIC_OR_DEPARTMENT_OTHER): Payer: Self-pay | Admitting: *Deleted

## 2013-08-17 NOTE — Progress Notes (Signed)
NPO AFTER MN. ARRIVE AT 0600.  PRE-OP ORDERS PENDING, CALLED OFFICE LM FOR DR LAVOIE.  OTHERWISE NEEDS HG.

## 2013-08-20 ENCOUNTER — Other Ambulatory Visit: Payer: Self-pay | Admitting: Obstetrics & Gynecology

## 2013-08-21 ENCOUNTER — Encounter (HOSPITAL_BASED_OUTPATIENT_CLINIC_OR_DEPARTMENT_OTHER): Payer: 59 | Admitting: Anesthesiology

## 2013-08-21 ENCOUNTER — Encounter (HOSPITAL_BASED_OUTPATIENT_CLINIC_OR_DEPARTMENT_OTHER)
Admission: RE | Disposition: A | Discharge status: Home / Self Care | Payer: Self-pay | Source: Ambulatory Visit | Attending: Obstetrics & Gynecology

## 2013-08-21 ENCOUNTER — Ambulatory Visit (HOSPITAL_BASED_OUTPATIENT_CLINIC_OR_DEPARTMENT_OTHER)
Admission: RE | Admit: 2013-08-21 | Discharge: 2013-08-21 | Disposition: A | Discharge status: Home / Self Care | Payer: 59 | Source: Ambulatory Visit | Attending: Obstetrics & Gynecology | Admitting: Obstetrics & Gynecology

## 2013-08-21 ENCOUNTER — Ambulatory Visit (HOSPITAL_BASED_OUTPATIENT_CLINIC_OR_DEPARTMENT_OTHER): Payer: 59 | Admitting: Anesthesiology

## 2013-08-21 ENCOUNTER — Encounter (HOSPITAL_BASED_OUTPATIENT_CLINIC_OR_DEPARTMENT_OTHER): Payer: Self-pay | Admitting: *Deleted

## 2013-08-21 DIAGNOSIS — O021 Missed abortion: Secondary | ICD-10-CM | POA: Insufficient documentation

## 2013-08-21 DIAGNOSIS — K219 Gastro-esophageal reflux disease without esophagitis: Secondary | ICD-10-CM | POA: Insufficient documentation

## 2013-08-21 HISTORY — DX: Gastro-esophageal reflux disease without esophagitis: K21.9

## 2013-08-21 HISTORY — PX: DILATION AND EVACUATION: SHX1459

## 2013-08-21 LAB — CBC
HCT: 37.9 % (ref 36.0–46.0)
Hemoglobin: 13 g/dL (ref 12.0–15.0)
MCH: 29.6 pg (ref 26.0–34.0)
MCHC: 34.3 g/dL (ref 30.0–36.0)
MCV: 86.3 fL (ref 78.0–100.0)
Platelets: 276 10*3/uL (ref 150–400)
RBC: 4.39 MIL/uL (ref 3.87–5.11)
RDW: 13.2 % (ref 11.5–15.5)
WBC: 7.6 10*3/uL (ref 4.0–10.5)

## 2013-08-21 SURGERY — DILATION AND EVACUATION, UTERUS
Anesthesia: Monitor Anesthesia Care | Site: Vagina | Wound class: Clean Contaminated

## 2013-08-21 MED ORDER — PROPOFOL 10 MG/ML IV BOLUS
INTRAVENOUS | Status: DC | PRN
  Administered 2013-08-21: 40 mg via INTRAVENOUS

## 2013-08-21 MED ORDER — ONDANSETRON HCL 4 MG/2ML IJ SOLN
INTRAMUSCULAR | Status: DC | PRN
  Administered 2013-08-21: 4 mg via INTRAVENOUS

## 2013-08-21 MED ORDER — CEFAZOLIN SODIUM-DEXTROSE 2-3 GM-% IV SOLR
2.0000 g | INTRAVENOUS | Status: AC
  Administered 2013-08-21: 2 g via INTRAVENOUS
  Filled 2013-08-21: qty 50

## 2013-08-21 MED ORDER — PROPOFOL 10 MG/ML IV EMUL
INTRAVENOUS | Status: DC | PRN
  Administered 2013-08-21: 100 ug/kg/min via INTRAVENOUS

## 2013-08-21 MED ORDER — HYDROMORPHONE HCL PF 1 MG/ML IJ SOLN
0.2500 mg | INTRAMUSCULAR | Status: DC | PRN
  Filled 2013-08-21: qty 1

## 2013-08-21 MED ORDER — LIDOCAINE HCL 1 % IJ SOLN
INTRAMUSCULAR | Status: DC | PRN
  Administered 2013-08-21: 20 mL

## 2013-08-21 MED ORDER — LACTATED RINGERS IV SOLN
INTRAVENOUS | Status: DC
  Administered 2013-08-21: 06:00:00 via INTRAVENOUS
  Filled 2013-08-21: qty 1000

## 2013-08-21 MED ORDER — MIDAZOLAM HCL 5 MG/5ML IJ SOLN
INTRAMUSCULAR | Status: DC | PRN
  Administered 2013-08-21: 2 mg via INTRAVENOUS

## 2013-08-21 MED ORDER — KETOROLAC TROMETHAMINE 30 MG/ML IJ SOLN
INTRAMUSCULAR | Status: DC | PRN
  Administered 2013-08-21: 30 mg via INTRAVENOUS

## 2013-08-21 MED ORDER — HYDROCODONE-IBUPROFEN 7.5-200 MG PO TABS: 1.0000 | ORAL_TABLET | Freq: Three times a day (TID) | ORAL | Status: DC | PRN

## 2013-08-21 MED ORDER — MEPERIDINE HCL 25 MG/ML IJ SOLN
6.2500 mg | INTRAMUSCULAR | Status: DC | PRN
  Filled 2013-08-21: qty 1

## 2013-08-21 MED ORDER — FENTANYL CITRATE 0.05 MG/ML IJ SOLN
INTRAMUSCULAR | Status: DC | PRN
  Administered 2013-08-21 (×2): 50 ug via INTRAVENOUS

## 2013-08-21 MED ORDER — DEXAMETHASONE SODIUM PHOSPHATE 4 MG/ML IJ SOLN
INTRAMUSCULAR | Status: DC | PRN
  Administered 2013-08-21: 10 mg via INTRAVENOUS

## 2013-08-21 MED ORDER — OXYCODONE HCL 5 MG PO TABS
5.0000 mg | ORAL_TABLET | Freq: Once | ORAL | Status: DC | PRN
  Filled 2013-08-21: qty 1

## 2013-08-21 MED ORDER — OXYCODONE HCL 5 MG/5ML PO SOLN
5.0000 mg | Freq: Once | ORAL | Status: DC | PRN
  Filled 2013-08-21: qty 5

## 2013-08-21 MED ORDER — PROMETHAZINE HCL 25 MG/ML IJ SOLN
6.2500 mg | INTRAMUSCULAR | Status: DC | PRN
  Filled 2013-08-21: qty 1

## 2013-08-21 SURGICAL SUPPLY — 27 items
CATH ROBINSON RED A/P 16FR (CATHETERS) IMPLANT
CLOTH BEACON ORANGE TIMEOUT ST (SAFETY) IMPLANT
DRAPE UNDERBUTTOCKS STRL (DRAPE) IMPLANT
DRESSING TELFA 8X3 (GAUZE/BANDAGES/DRESSINGS) IMPLANT
GLOVE BIO SURGEON STRL SZ 6 (GLOVE) ×2 IMPLANT
GLOVE BIO SURGEON STRL SZ 6.5 (GLOVE) ×2 IMPLANT
GLOVE BIOGEL PI IND STRL 6.5 (GLOVE) ×1 IMPLANT
GLOVE BIOGEL PI INDICATOR 6.5 (GLOVE) ×1
GLOVE INDICATOR 7.5 STRL GRN (GLOVE) ×2 IMPLANT
GOWN PREVENTION PLUS LG XLONG (DISPOSABLE) ×2 IMPLANT
KIT BERKELEY 1ST TRIMESTER 3/8 (MISCELLANEOUS) IMPLANT
NEEDLE SPNL 22GX3.5 QUINCKE BK (NEEDLE) ×2 IMPLANT
NS IRRIG 500ML POUR BTL (IV SOLUTION) ×2 IMPLANT
PACK BASIN DAY SURGERY FS (CUSTOM PROCEDURE TRAY) ×2 IMPLANT
PAD OB MATERNITY 4.3X12.25 (PERSONAL CARE ITEMS) ×2 IMPLANT
PAD PREP 24X48 CUFFED NSTRL (MISCELLANEOUS) ×2 IMPLANT
SCOPETTES 8  STERILE (MISCELLANEOUS)
SCOPETTES 8 STERILE (MISCELLANEOUS) IMPLANT
SET BERKELEY SUCTION TUBING (SUCTIONS) ×2 IMPLANT
SYR CONTROL 10ML LL (SYRINGE) IMPLANT
TOP DISP BERKELEY (MISCELLANEOUS) IMPLANT
TOWEL OR 17X24 6PK STRL BLUE (TOWEL DISPOSABLE) ×2 IMPLANT
TRAY DSU PREP LF (CUSTOM PROCEDURE TRAY) ×2 IMPLANT
VACURETTE 10 RIGID CVD (CANNULA) ×2 IMPLANT
VACURETTE 7MM CVD STRL WRAP (CANNULA) IMPLANT
VACURETTE 8 RIGID CVD (CANNULA) IMPLANT
VACURETTE 9 RIGID CVD (CANNULA) IMPLANT

## 2013-08-21 NOTE — Op Note (Signed)
08/21/2013  7:59 AM  PATIENT:  Renee [name]  42 y.o. female  PRE-OPERATIVE DIAGNOSIS:  Missed Abortion  [id number]  POST-OPERATIVE DIAGNOSIS:  Missed Abortion  [phone]  PROCEDURE:  Procedure(s): DILATATION AND EVACUATION  SURGEON:  Surgeon(s): Genia Del, MD  ASSISTANTS: none   ANESTHESIA:   IV sedation and paracervical block  PROCEDURE:  Under IV sedation the patient is in lithotomy position. She is prepped with Betadine on the suprapubic, vulvar and vaginal areas.  She is draped as usual the vaginal exam reveals a retroverted uterus about 9-10 cm, mobile. No adnexal mass. The speculum is inserted in the vagina. The anterior lip of the cervix was grasped with a tenaculum. A paracervical block is done with lidocaine 1% a total of 20 cc at 4 and 8:00. Dilation of the cervix with Hegar dilators up to #33 without difficulty. We used a #10 curved suction curet to suction the products of conception. We then used a sharp curet gently on all intrauterine surfaces to assure that all products of conception were removed.  We then go back with the suction curette and evacuates all blood clots. The uterus contracts well and the instrument. The tenaculum was removed from the cervix. Hemostasis is adequate. The speculum is removed. The patient is brought to recovery room in good stable status.  ESTIMATED BLOOD LOSS: 100 cc   Intake/Output Summary (Last 24 hours) at 08/21/13 0759 Last data filed at 08/21/13 9629  Gross per 24 hour  Intake    200 ml  Output      0 ml  Net    200 ml     BLOOD ADMINISTERED:none   LOCAL MEDICATIONS USED:  LIDOCAINE   SPECIMEN:  Source of Specimen:  POC  DISPOSITION OF SPECIMEN:  PATHOLOGY  COUNTS:  YES  PLAN OF CARE: Transfer to PACU  Genia Del MD  08/21/2013 at 7:59 am

## 2013-08-21 NOTE — Discharge Summary (Signed)
  Physician Discharge Summary  Patient ID: Renee [name] MRN: [id number] DOB/AGE: 05-12-1971 42 y.o.  Admit date: 08/21/2013 Discharge date: 08/21/2013  Admission Diagnoses: Missed Abortion  59820  Discharge Diagnoses: Missed Abortion  [id number]        Active Problems:   * No active hospital problems. *   Discharged Condition: good  Hospital Course: good  Consults: None  Treatments: surgery: D+E  Disposition: 01-Home or Self Care     Medication List         calcium carbonate 500 MG chewable tablet  Commonly known as:  TUMS - dosed in mg elemental calcium  Chew 1 tablet by mouth daily as needed. For heartburn     COMBIGAN 0.2-0.5 % ophthalmic solution  Generic drug:  brimonidine-timolol  Place 1 drop into the right eye 2 (two) times daily.     DUREZOL 0.05 % Emul  Generic drug:  Difluprednate  Place 1 drop into the right eye 2 (two) times daily.     HYDROcodone-ibuprofen 7.5-200 MG per tablet  Commonly known as:  VICOPROFEN  Take 1 tablet by mouth every 8 (eight) hours as needed for pain.     ibuprofen 600 MG tablet  Commonly known as:  ADVIL,MOTRIN  Take 1 tablet (600 mg total) by mouth every 6 (six) hours.     prenatal multivitamin Tabs tablet  Take 1 tablet by mouth daily.           Follow-up Information   Follow up with Avyaan Summer,MARIE-LYNE, MD In 4 weeks.   Specialty:  Obstetrics and Gynecology   Contact information:   [address] [place] [address] [phone]       Signed: Genia Del, MD 08/21/2013, 8:11 AM

## 2013-08-21 NOTE — Anesthesia Preprocedure Evaluation (Addendum)
Anesthesia Evaluation  Patient identified by MRN, date of birth, ID band Patient awake    Reviewed: Allergy & Precautions, H&P , NPO status , Patient's Chart, lab work & pertinent test results  Airway Mallampati: II TM Distance: >3 FB Neck ROM: full    Dental  (+) Teeth Intact   Pulmonary neg pulmonary ROS,  breath sounds clear to auscultation        Cardiovascular negative cardio ROS  Rhythm:regular Rate:Normal     Neuro/Psych negative neurological ROS  negative psych ROS   GI/Hepatic negative GI ROS, Neg liver ROS, GERD-  ,  Endo/Other  negative endocrine ROS  Renal/GU negative Renal ROS     Musculoskeletal negative musculoskeletal ROS (+)   Abdominal (+) + obese,   Peds  Hematology negative hematology ROS (+)   Anesthesia Other Findings       Reproductive/Obstetrics negative OB ROS                           Anesthesia Physical  Anesthesia Plan  ASA: II  Anesthesia Plan: MAC   Post-op Pain Management:    Induction: Intravenous  Airway Management Planned:   Additional Equipment:   Intra-op Plan:   Post-operative Plan:   Informed Consent: I have reviewed the patients History and Physical, chart, labs and discussed the procedure including the risks, benefits and alternatives for the proposed anesthesia with the patient or authorized representative who has indicated his/her understanding and acceptance.   Dental advisory given  Plan Discussed with: CRNA  Anesthesia Plan Comments:       Anesthesia Quick Evaluation

## 2013-08-21 NOTE — Anesthesia Postprocedure Evaluation (Signed)
Anesthesia Post Note  Patient: Renee [name]  Procedure(s) Performed: Procedure(s) (LRB): DILATATION AND EVACUATION (N/A)  Anesthesia type: MAC  Patient location: PACU  Post pain: Pain level controlled  Post assessment: Post-op Vital signs reviewed  Last Vitals: BP 83/52  Pulse 61  Temp(Src) 36.6 C (Oral)  Resp 13  Ht 5\' 3"  (1.6 m)  Wt 156 lb 8 oz (70.988 kg)  BMI 27.73 kg/m2  SpO2 95%  LMP 06/03/2013  Breastfeeding? No  Post vital signs: Reviewed  Level of consciousness: awake  Complications: No apparent anesthesia complications

## 2013-08-21 NOTE — Transfer of Care (Signed)
Immediate Anesthesia Transfer of Care Note  Patient: Renee [name]  Procedure(s) Performed: Procedure(s): DILATATION AND EVACUATION (N/A)  Patient Location: PACU  Anesthesia Type:General  Level of Consciousness: awake and oriented  Airway & Oxygen Therapy: Patient Spontanous Breathing  Post-op Assessment: Report given to PACU RN  Post vital signs: Reviewed and stable  Complications: No apparent anesthesia complications

## 2013-08-21 NOTE — H&P (Signed)
Addyson Traub is an 42 y.o. female G96P1001  RP:  MAB for D+E  Pertinent Gynecological History:  Blood transfusions: none  Last mammogram: wnl Last pap: wnl OB History: G1P1001   Menstrual History:Patient's last menstrual period was 06/03/2013.    Past Medical History  Diagnosis Date  . HSV infection     Has never had an outbreak on valtrex  . Unspecified iridocyclitis     right eye  . Genital herpes, unspecified   . Preglaucoma, unspecified     elevated IOP/  right eye  . AMA (advanced maternal age) multigravida 35+   . GERD (gastroesophageal reflux disease)   . Missed ab     Past Surgical History  Procedure Laterality Date  . Cataract extraction w/ intraocular lens implant Right APRIL 2014  . Anal fissure repair  AGE 67    Family History  Problem Relation Age of Onset  . Diabetes Mother   . Hypertension Mother   . Diabetes Father   . Hypertension Father   . Cancer Father     Colon  . Heart disease Maternal Uncle     Social History:  reports that she has never smoked. She has never used smokeless tobacco. She reports that she does not drink alcohol or use illicit drugs.  Allergies: No Known Allergies  Prescriptions prior to admission  Medication Sig Dispense Refill  . brimonidine-timolol (COMBIGAN) 0.2-0.5 % ophthalmic solution Place 1 drop into the right eye 2 (two) times daily.      . Difluprednate (DUREZOL) 0.05 % EMUL Place 1 drop into the right eye 2 (two) times daily.       . Prenatal Vit-Fe Fumarate-FA (PRENATAL MULTIVITAMIN) TABS Take 1 tablet by mouth daily.      . calcium carbonate (TUMS - DOSED IN MG ELEMENTAL CALCIUM) 500 MG chewable tablet Chew 1 tablet by mouth daily as needed. For heartburn      . ibuprofen (ADVIL,MOTRIN) 600 MG tablet Take 1 tablet (600 mg total) by mouth every 6 (six) hours.  30 tablet  1    ROS  Blood pressure 103/63, pulse 80, temperature 97.9 F (36.6 C), temperature source Oral, resp. rate 16, height 5\' 3"  (1.6 m),  weight 156 lb 8 oz (70.988 kg), last menstrual period 06/03/2013, SpO2 98.00%, not currently breastfeeding. Physical Exam  Results for orders placed during the hospital encounter of 08/21/13 (from the past 24 hour(s))  CBC     Status: None   Collection Time    08/21/13  6:20 AM      Result Value Range   WBC 7.6  4.0 - 10.5 K/uL   RBC 4.39  3.87 - 5.11 MIL/uL   Hemoglobin 13.0  12.0 - 15.0 g/dL   HCT 16.1  09.6 - 04.5 %   MCV 86.3  78.0 - 100.0 fL   MCH 29.6  26.0 - 34.0 pg   MCHC 34.3  30.0 - 36.0 g/dL   RDW 40.9  81.1 - 91.4 %   Platelets 276  150 - 400 K/uL    No results found.  Assessment/Plan: 9+ wks MAB for D+E.  Surgery and risks reviewed.  Rh pos.  Heather Mckendree,MARIE-LYNE 08/21/2013, 7:31 AM

## 2013-08-22 ENCOUNTER — Encounter (HOSPITAL_BASED_OUTPATIENT_CLINIC_OR_DEPARTMENT_OTHER): Payer: Self-pay | Admitting: Obstetrics & Gynecology

## 2013-09-22 ENCOUNTER — Emergency Department (HOSPITAL_COMMUNITY): Payer: 59

## 2013-09-22 ENCOUNTER — Emergency Department (HOSPITAL_COMMUNITY)
Admission: EM | Admit: 2013-09-22 | Discharge: 2013-09-22 | Disposition: A | Discharge status: Home / Self Care | Payer: 59 | Attending: Emergency Medicine | Admitting: Emergency Medicine

## 2013-09-22 ENCOUNTER — Encounter (HOSPITAL_COMMUNITY): Payer: Self-pay | Admitting: Emergency Medicine

## 2013-09-22 ENCOUNTER — Other Ambulatory Visit: Payer: Self-pay

## 2013-09-22 DIAGNOSIS — H409 Unspecified glaucoma: Secondary | ICD-10-CM | POA: Insufficient documentation

## 2013-09-22 DIAGNOSIS — R0602 Shortness of breath: Secondary | ICD-10-CM | POA: Insufficient documentation

## 2013-09-22 DIAGNOSIS — R091 Pleurisy: Secondary | ICD-10-CM | POA: Insufficient documentation

## 2013-09-22 DIAGNOSIS — Z8619 Personal history of other infectious and parasitic diseases: Secondary | ICD-10-CM | POA: Insufficient documentation

## 2013-09-22 LAB — BASIC METABOLIC PANEL
BUN: 19 mg/dL (ref 6–23)
CO2: 23 mEq/L (ref 19–32)
Calcium: 8.4 mg/dL (ref 8.4–10.5)
Chloride: 103 mEq/L (ref 96–112)
Creatinine, Ser: 0.62 mg/dL (ref 0.50–1.10)
GFR calc Af Amer: >90 mL/min (ref >90)
GFR calc non Af Amer: >90 mL/min (ref >90)
Glucose, Bld: 97 mg/dL (ref 70–99)
Potassium: 3.8 mEq/L (ref 3.5–5.1)
Sodium: 139 mEq/L (ref 135–145)

## 2013-09-22 LAB — POCT I-STAT TROPONIN I: Troponin i, poc: 0 ng/mL (ref 0.00–0.08)

## 2013-09-22 LAB — HEPATIC FUNCTION PANEL
ALT: 11 U/L (ref 0–35)
AST: 14 U/L (ref 0–37)
Albumin: 3.6 g/dL (ref 3.5–5.2)
Alkaline Phosphatase: 63 U/L (ref 39–117)
Bilirubin, Direct: <0.1 mg/dL (ref 0.0–0.3)
Total Bilirubin: 0.3 mg/dL (ref 0.3–1.2)
Total Protein: 7.1 g/dL (ref 6.0–8.3)

## 2013-09-22 LAB — CBC
HCT: 37 % (ref 36.0–46.0)
Hemoglobin: 12.5 g/dL (ref 12.0–15.0)
MCH: 30.1 pg (ref 26.0–34.0)
MCHC: 33.8 g/dL (ref 30.0–36.0)
MCV: 89.2 fL (ref 78.0–100.0)
Platelets: 259 10*3/uL (ref 150–400)
RBC: 4.15 MIL/uL (ref 3.87–5.11)
RDW: 13.4 % (ref 11.5–15.5)
WBC: 8.8 10*3/uL (ref 4.0–10.5)

## 2013-09-22 LAB — POCT PREGNANCY, URINE: Preg Test, Ur: NEGATIVE

## 2013-09-22 LAB — LIPASE, BLOOD: Lipase: 36 U/L (ref 11–59)

## 2013-09-22 MED ORDER — MORPHINE SULFATE 4 MG/ML IJ SOLN
4.0000 mg | Freq: Once | INTRAMUSCULAR | Status: AC
  Administered 2013-09-22: 4 mg via INTRAVENOUS
  Filled 2013-09-22: qty 1

## 2013-09-22 MED ORDER — SODIUM CHLORIDE 0.9 % IV BOLUS (SEPSIS)
1000.0000 mL | Freq: Once | INTRAVENOUS | Status: AC
  Administered 2013-09-22: 1000 mL via INTRAVENOUS

## 2013-09-22 MED ORDER — IOHEXOL 350 MG/ML SOLN
70.0000 mL | Freq: Once | INTRAVENOUS | Status: AC | PRN
  Administered 2013-09-22: 70 mL via INTRAVENOUS

## 2013-09-22 MED ORDER — ASPIRIN 325 MG PO TABS
325.0000 mg | ORAL_TABLET | ORAL | Status: AC
  Administered 2013-09-22: 325 mg via ORAL
  Filled 2013-09-22: qty 1

## 2013-09-22 NOTE — ED Notes (Signed)
Pt back from CT

## 2013-09-22 NOTE — ED Provider Notes (Signed)
CSN: [id number]     Arrival date & time 09/22/13  0405 History   First MD Initiated Contact with Patient 09/22/13 0424     Chief Complaint  Patient presents with  . Chest Pain   Patient is a 42 y.o. female presenting with back pain. The history is provided by the patient.  Back Pain Pain location: right parathoracic region. Quality:  Stabbing Radiates to: right chest. Pain severity:  Moderate Pain is:  Same all the time Onset quality:  Sudden Duration:  1 day Timing:  Intermittent Progression:  Unchanged Chronicity:  New Context comment:  Recent air travel (16 hour flight from [place]) Relieved by:  Nothing Worsened by:  Deep breathing Associated symptoms: chest pain   Associated symptoms: no fever    Pt reports she just got back from long trip (>16 hr flight) Since returning she has had pain in back that radiates into chest, worse with deep breathing No trauma No pain with palpation of chest/back No abd pain She denies h/o CAD/DVT/PE She did have dilatation and evacuation last month for missed abortion She is currently on her period   Past Medical History  Diagnosis Date  . HSV infection     Has never had an outbreak on valtrex  . Unspecified iridocyclitis     right eye  . Genital herpes, unspecified   . Preglaucoma, unspecified     elevated IOP/  right eye  . AMA (advanced maternal age) multigravida 35+   . GERD (gastroesophageal reflux disease)   . Missed ab    Past Surgical History  Procedure Laterality Date  . Cataract extraction w/ intraocular lens implant Right APRIL 2014  . Anal fissure repair  AGE 44  . Dilation and evacuation N/A 08/21/2013    Procedure: DILATATION AND EVACUATION;  Surgeon: Genia Del, MD;  Location: [facility];  Service: Gynecology;  Laterality: N/A;   Family History  Problem Relation Age of Onset  . Diabetes Mother   . Hypertension Mother   . Diabetes Father   . Hypertension Father   . Cancer Father     Colon   . Heart disease Maternal Uncle    History  Substance Use Topics  . Smoking status: Never Smoker   . Smokeless tobacco: Never Used  . Alcohol Use: No   OB History   Grav Para Term Preterm Abortions TAB SAB Ect Mult Living   1 1 1  0 0 0 0 0 0 1     Review of Systems  Constitutional: Negative for fever.  Respiratory: Positive for shortness of breath.   Cardiovascular: Positive for chest pain. Negative for leg swelling.  Gastrointestinal: Negative for vomiting and blood in stool.  Musculoskeletal: Positive for back pain.  All other systems reviewed and are negative.    Allergies  Review of patient's allergies indicates no known allergies.  Home Medications   Current Outpatient Rx  Name  Route  Sig  Dispense  Refill  . brimonidine-timolol (COMBIGAN) 0.2-0.5 % ophthalmic solution   Right Eye   Place 1 drop into the right eye 2 (two) times daily.         . calcium carbonate (TUMS - DOSED IN MG ELEMENTAL CALCIUM) 500 MG chewable tablet   Oral   Chew 1 tablet by mouth daily as needed. For heartburn         . Difluprednate (DUREZOL) 0.05 % EMUL   Right Eye   Place 1 drop into the right eye 2 (two)  times daily.          Marland Kitchen HYDROcodone-ibuprofen (VICOPROFEN) 7.5-200 MG per tablet   Oral   Take 1 tablet by mouth every 8 (eight) hours as needed for pain.   30 tablet   0   . ibuprofen (ADVIL,MOTRIN) 600 MG tablet   Oral   Take 1 tablet (600 mg total) by mouth every 6 (six) hours.   30 tablet   1   . Prenatal Vit-Fe Fumarate-FA (PRENATAL MULTIVITAMIN) TABS   Oral   Take 1 tablet by mouth daily.          Pulse 90  Temp(Src) 98 F (36.7 C) (Oral)  Resp 17  Ht 5\' 2"  (1.575 m)  Wt 158 lb (71.668 kg)  BMI 28.89 kg/m2  SpO2 99%  LMP 09/17/2013  Breastfeeding? No Physical Exam CONSTITUTIONAL: Well developed/well nourished HEAD: Normocephalic/atraumatic EYES: EOMI/PERRL ENMT: Mucous membranes moist NECK: supple no meningeal signs SPINE:entire spine  nontender There is no reproducible back tenderness Chest - no tenderness to anterior chest wall CV: S1/S2 noted, no murmurs/rubs/gallops noted LUNGS: Lungs are clear to auscultation bilaterally, no apparent distress ABDOMEN: soft, nontender, no rebound or guarding GU:no cva tenderness NEURO: Pt is awake/alert, moves all extremitiesx4 EXTREMITIES: pulses normal, full ROM, no calf tenderness and no significant edema noted SKIN: warm, color normal PSYCH: no abnormalities of mood noted  ED Course  Procedures (including critical care time)  5:08 AM Pt with pleuritic CP with recent long distance travel.  Appears high risk for PE (PE likely by Wells score) Will proceed directly to CT chest to r/o PE Pt agreeable with plan  7:02 AM CT chest negative for PE No hypoxia, no tachycardia, don't feel further workup necessary She feels improved, ambulatory, no distress Her CP is improved Will hold until >2hrs since last narcotic dose (pt ambulatory, no distress, no ataxia) as she is driving home  Labs Review Labs Reviewed  BASIC METABOLIC PANEL  CBC  HEPATIC FUNCTION PANEL  LIPASE, BLOOD  POCT I-STAT TROPONIN I   Imaging Review Dg Chest Port 1 View  09/22/2013   CLINICAL DATA:  Chest pain  EXAM: PORTABLE CHEST - 1 VIEW  COMPARISON:  Prior radiograph from 10/06/2012  FINDINGS: The cardiac and mediastinal silhouettes are stable in size and contour, and remain within normal limits.  Lung volumes are normal. No focal infiltrate, pulmonary edema, pleural effusion, or pneumothorax identified.  No acute osseous abnormality.  IMPRESSION: No active disease.   Electronically Signed   By: Rise Mu M.D.   On: 09/22/2013 04:50    EKG Interpretation   None       Date: 09/22/2013  Rate: 88  Rhythm: normal sinus rhythm  QRS Axis: normal  Intervals: normal  ST/T Wave abnormalities: nonspecific T wave changes  Conduction Disutrbances:none  Narrative Interpretation:   Old EKG Reviewed:  none available at time of interpretation    MDM  No diagnosis found. Nursing notes including past medical history and social history reviewed and considered in documentation xrays reviewed and considered Labs/vital reviewed and considered     Joya Gaskins, MD 09/22/13 [phone]

## 2013-09-22 NOTE — ED Notes (Signed)
Patient discharged to home with family. NAD.  

## 2013-09-22 NOTE — ED Notes (Signed)
Pt traveled from [place] on long flight, arriving Thursday evening.  Patient felt chest pain that started in back and radiated to chest, progressively worse.  Hurts worse with inspiration

## 2013-10-09 DIAGNOSIS — H15009 Unspecified scleritis, unspecified eye: Secondary | ICD-10-CM | POA: Insufficient documentation

## 2013-10-15 DIAGNOSIS — Z227 Latent tuberculosis: Secondary | ICD-10-CM | POA: Insufficient documentation

## 2013-10-15 DIAGNOSIS — H3020 Posterior cyclitis, unspecified eye: Secondary | ICD-10-CM | POA: Insufficient documentation

## 2013-10-18 NOTE — L&D Delivery Note (Signed)
Delivery Note At 6:50 PM a healthy female was delivered via Vaginal, Spontaneous Delivery (Presentation: Occiput Anterior).  APGAR: 8, 9; weight pending.   Placenta status: Intact, Spontaneous.  Cord: 3 vessels with the following complications: None.  Cord pH: Not done  Anesthesia: Epidural  Episiotomy: None Lacerations: 2nd degree;Perineal Suture Repair: 2.0 3.0 vicryl rapide Est. Blood Loss (mL): 300  Mom to postpartum.  Baby to Couplet care / Skin to Skin.  Renee [name],MARIE-LYNE 07/12/2014, 8:25 PM

## 2013-10-24 DIAGNOSIS — H26499 Other secondary cataract, unspecified eye: Secondary | ICD-10-CM | POA: Insufficient documentation

## 2014-01-02 LAB — OB RESULTS CONSOLE HIV ANTIBODY (ROUTINE TESTING): HIV: NONREACTIVE

## 2014-01-02 LAB — OB RESULTS CONSOLE HEPATITIS B SURFACE ANTIGEN: Hepatitis B Surface Ag: NEGATIVE

## 2014-01-02 LAB — OB RESULTS CONSOLE GC/CHLAMYDIA
Chlamydia: NEGATIVE
Gonorrhea: NEGATIVE

## 2014-01-02 LAB — OB RESULTS CONSOLE ABO/RH: RH Type: POSITIVE

## 2014-01-02 LAB — OB RESULTS CONSOLE ANTIBODY SCREEN: Antibody Screen: NEGATIVE

## 2014-01-02 LAB — OB RESULTS CONSOLE RUBELLA ANTIBODY, IGM: Rubella: IMMUNE

## 2014-01-02 LAB — OB RESULTS CONSOLE RPR: RPR: NONREACTIVE

## 2014-01-02 LAB — OB RESULTS CONSOLE GBS: GBS: POSITIVE

## 2014-01-25 ENCOUNTER — Other Ambulatory Visit: Payer: 59

## 2014-07-03 ENCOUNTER — Other Ambulatory Visit: Payer: Self-pay | Admitting: Obstetrics & Gynecology

## 2014-07-09 ENCOUNTER — Encounter (HOSPITAL_COMMUNITY): Payer: Self-pay | Admitting: *Deleted

## 2014-07-09 ENCOUNTER — Telehealth (HOSPITAL_COMMUNITY): Payer: Self-pay | Admitting: *Deleted

## 2014-07-09 NOTE — Telephone Encounter (Signed)
Preadmission screen  

## 2014-07-12 ENCOUNTER — Inpatient Hospital Stay (HOSPITAL_COMMUNITY): Payer: 59 | Admitting: Anesthesiology

## 2014-07-12 ENCOUNTER — Encounter (HOSPITAL_COMMUNITY): Payer: Self-pay

## 2014-07-12 ENCOUNTER — Encounter (HOSPITAL_COMMUNITY): Payer: 59 | Admitting: Anesthesiology

## 2014-07-12 ENCOUNTER — Inpatient Hospital Stay (HOSPITAL_COMMUNITY)
Admission: RE | Admit: 2014-07-12 | Discharge: 2014-07-14 | DRG: 775 | Disposition: A | Discharge status: Home / Self Care | Payer: 59 | Source: Ambulatory Visit | Attending: Obstetrics & Gynecology | Admitting: Obstetrics & Gynecology

## 2014-07-12 DIAGNOSIS — O3660X0 Maternal care for excessive fetal growth, unspecified trimester, not applicable or unspecified: Principal | ICD-10-CM | POA: Diagnosis present

## 2014-07-12 DIAGNOSIS — Z22330 Carrier of Group B streptococcus: Secondary | ICD-10-CM

## 2014-07-12 DIAGNOSIS — Z8249 Family history of ischemic heart disease and other diseases of the circulatory system: Secondary | ICD-10-CM

## 2014-07-12 DIAGNOSIS — O9989 Other specified diseases and conditions complicating pregnancy, childbirth and the puerperium: Secondary | ICD-10-CM

## 2014-07-12 DIAGNOSIS — E669 Obesity, unspecified: Secondary | ICD-10-CM | POA: Diagnosis present

## 2014-07-12 DIAGNOSIS — Z833 Family history of diabetes mellitus: Secondary | ICD-10-CM

## 2014-07-12 DIAGNOSIS — O09529 Supervision of elderly multigravida, unspecified trimester: Secondary | ICD-10-CM | POA: Diagnosis present

## 2014-07-12 DIAGNOSIS — Z6837 Body mass index (BMI) 37.0-37.9, adult: Secondary | ICD-10-CM

## 2014-07-12 DIAGNOSIS — K219 Gastro-esophageal reflux disease without esophagitis: Secondary | ICD-10-CM | POA: Diagnosis present

## 2014-07-12 DIAGNOSIS — O99892 Other specified diseases and conditions complicating childbirth: Secondary | ICD-10-CM | POA: Diagnosis present

## 2014-07-12 DIAGNOSIS — O99214 Obesity complicating childbirth: Secondary | ICD-10-CM

## 2014-07-12 DIAGNOSIS — Z3490 Encounter for supervision of normal pregnancy, unspecified, unspecified trimester: Secondary | ICD-10-CM

## 2014-07-12 LAB — RPR

## 2014-07-12 LAB — CBC
HCT: 37.5 % (ref 36.0–46.0)
Hemoglobin: 12.5 g/dL (ref 12.0–15.0)
MCH: 30.8 pg (ref 26.0–34.0)
MCHC: 33.3 g/dL (ref 30.0–36.0)
MCV: 92.4 fL (ref 78.0–100.0)
Platelets: 300 10*3/uL (ref 150–400)
RBC: 4.06 MIL/uL (ref 3.87–5.11)
RDW: 14.9 % (ref 11.5–15.5)
WBC: 12 10*3/uL — ABNORMAL HIGH (ref 4.0–10.5)

## 2014-07-12 LAB — TYPE AND SCREEN
ABO/RH(D): AB POS
Antibody Screen: NEGATIVE

## 2014-07-12 MED ORDER — OXYCODONE-ACETAMINOPHEN 5-325 MG PO TABS
2.0000 | ORAL_TABLET | ORAL | Status: DC | PRN
Start: 2014-07-12 — End: 2014-07-14

## 2014-07-12 MED ORDER — TERBUTALINE SULFATE 1 MG/ML IJ SOLN: 0.2500 mg | Freq: Once | INTRAMUSCULAR | Status: DC | PRN

## 2014-07-12 MED ORDER — LACTATED RINGERS IV SOLN: INTRAVENOUS | Status: DC

## 2014-07-12 MED ORDER — DIBUCAINE 1 % RE OINT
1.0000 | TOPICAL_OINTMENT | RECTAL | Status: DC | PRN
Start: 2014-07-12 — End: 2014-07-14

## 2014-07-12 MED ORDER — LIDOCAINE HCL (PF) 1 % IJ SOLN
INTRAMUSCULAR | Status: DC | PRN
  Administered 2014-07-12 (×2): 4 mL

## 2014-07-12 MED ORDER — ZOLPIDEM TARTRATE 5 MG PO TABS: 5.0000 mg | ORAL_TABLET | Freq: Every evening | ORAL | Status: DC | PRN

## 2014-07-12 MED ORDER — ONDANSETRON HCL 4 MG/2ML IJ SOLN
4.0000 mg | Freq: Four times a day (QID) | INTRAMUSCULAR | Status: DC | PRN
  Administered 2014-07-12: 4 mg via INTRAVENOUS
  Filled 2014-07-12: qty 2

## 2014-07-12 MED ORDER — OXYCODONE-ACETAMINOPHEN 5-325 MG PO TABS: 2.0000 | ORAL_TABLET | ORAL | Status: DC | PRN

## 2014-07-12 MED ORDER — LACTATED RINGERS IV SOLN: 500.0000 mL | INTRAVENOUS | Status: DC | PRN

## 2014-07-12 MED ORDER — DIPHENHYDRAMINE HCL 50 MG/ML IJ SOLN: 12.5000 mg | INTRAMUSCULAR | Status: DC | PRN

## 2014-07-12 MED ORDER — OXYCODONE-ACETAMINOPHEN 5-325 MG PO TABS: 1.0000 | ORAL_TABLET | ORAL | Status: DC | PRN

## 2014-07-12 MED ORDER — OXYCODONE-ACETAMINOPHEN 5-325 MG PO TABS
1.0000 | ORAL_TABLET | ORAL | Status: DC | PRN
  Administered 2014-07-12: 1 via ORAL
  Filled 2014-07-12: qty 1

## 2014-07-12 MED ORDER — PENICILLIN G POTASSIUM 5000000 UNITS IJ SOLR
2.5000 10*6.[IU] | INTRAVENOUS | Status: DC
  Filled 2014-07-12 (×3): qty 2.5

## 2014-07-12 MED ORDER — LACTATED RINGERS IV SOLN: 500.0000 mL | Freq: Once | INTRAVENOUS | Status: DC

## 2014-07-12 MED ORDER — PHENYLEPHRINE 40 MCG/ML (10ML) SYRINGE FOR IV PUSH (FOR BLOOD PRESSURE SUPPORT)
80.0000 ug | PREFILLED_SYRINGE | INTRAVENOUS | Status: DC | PRN
  Filled 2014-07-12: qty 2

## 2014-07-12 MED ORDER — ACETAMINOPHEN 325 MG PO TABS: 650.0000 mg | ORAL_TABLET | ORAL | Status: DC | PRN

## 2014-07-12 MED ORDER — LANOLIN HYDROUS EX OINT: TOPICAL_OINTMENT | CUTANEOUS | Status: DC | PRN

## 2014-07-12 MED ORDER — SENNOSIDES-DOCUSATE SODIUM 8.6-50 MG PO TABS
2.0000 | ORAL_TABLET | ORAL | Status: DC
  Administered 2014-07-12: 2 via ORAL
  Filled 2014-07-12 (×2): qty 2

## 2014-07-12 MED ORDER — PENICILLIN G POTASSIUM 5000000 UNITS IJ SOLR
2.5000 10*6.[IU] | INTRAVENOUS | Status: DC
  Administered 2014-07-12 (×2): 2.5 10*6.[IU] via INTRAVENOUS
  Filled 2014-07-12 (×5): qty 2.5

## 2014-07-12 MED ORDER — DIPHENHYDRAMINE HCL 25 MG PO CAPS: 25.0000 mg | ORAL_CAPSULE | Freq: Four times a day (QID) | ORAL | Status: DC | PRN

## 2014-07-12 MED ORDER — LACTATED RINGERS IV SOLN
INTRAVENOUS | Status: DC
  Administered 2014-07-12: 125 mL/h via INTRAVENOUS

## 2014-07-12 MED ORDER — FENTANYL 2.5 MCG/ML BUPIVACAINE 1/10 % EPIDURAL INFUSION (WH - ANES)
INTRAMUSCULAR | Status: DC | PRN
  Administered 2014-07-12: 14 mL/h via EPIDURAL

## 2014-07-12 MED ORDER — TETANUS-DIPHTH-ACELL PERTUSSIS 5-2.5-18.5 LF-MCG/0.5 IM SUSP: 0.5000 mL | Freq: Once | INTRAMUSCULAR | Status: DC

## 2014-07-12 MED ORDER — EPHEDRINE 5 MG/ML INJ: 10.0000 mg | INTRAVENOUS | Status: DC | PRN

## 2014-07-12 MED ORDER — PENICILLIN G POTASSIUM 5000000 UNITS IJ SOLR
5.0000 10*6.[IU] | Freq: Once | INTRAVENOUS | Status: AC
  Administered 2014-07-12: 5 10*6.[IU] via INTRAVENOUS
  Filled 2014-07-12: qty 5

## 2014-07-12 MED ORDER — ONDANSETRON HCL 4 MG/2ML IJ SOLN: 4.0000 mg | INTRAMUSCULAR | Status: DC | PRN

## 2014-07-12 MED ORDER — EPHEDRINE 5 MG/ML INJ
10.0000 mg | INTRAVENOUS | Status: DC | PRN
  Filled 2014-07-12: qty 2

## 2014-07-12 MED ORDER — BENZOCAINE-MENTHOL 20-0.5 % EX AERO
1.0000 "application " | INHALATION_SPRAY | CUTANEOUS | Status: DC | PRN
  Filled 2014-07-12: qty 56

## 2014-07-12 MED ORDER — ONDANSETRON HCL 4 MG PO TABS: 4.0000 mg | ORAL_TABLET | ORAL | Status: DC | PRN

## 2014-07-12 MED ORDER — FLEET ENEMA 7-19 GM/118ML RE ENEM: 1.0000 | ENEMA | RECTAL | Status: DC | PRN

## 2014-07-12 MED ORDER — OXYTOCIN 40 UNITS IN LACTATED RINGERS INFUSION - SIMPLE MED
1.0000 m[IU]/min | INTRAVENOUS | Status: DC
  Administered 2014-07-12: 2 m[IU]/min via INTRAVENOUS

## 2014-07-12 MED ORDER — LIDOCAINE HCL (PF) 1 % IJ SOLN
30.0000 mL | INTRAMUSCULAR | Status: DC | PRN
  Filled 2014-07-12: qty 30

## 2014-07-12 MED ORDER — PHENYLEPHRINE 40 MCG/ML (10ML) SYRINGE FOR IV PUSH (FOR BLOOD PRESSURE SUPPORT)
80.0000 ug | PREFILLED_SYRINGE | INTRAVENOUS | Status: DC | PRN
  Filled 2014-07-12: qty 2
  Filled 2014-07-12: qty 10

## 2014-07-12 MED ORDER — IBUPROFEN 600 MG PO TABS
600.0000 mg | ORAL_TABLET | Freq: Four times a day (QID) | ORAL | Status: DC
  Administered 2014-07-12 – 2014-07-14 (×7): 600 mg via ORAL
  Filled 2014-07-12 (×7): qty 1

## 2014-07-12 MED ORDER — OXYTOCIN BOLUS FROM INFUSION
500.0000 mL | INTRAVENOUS | Status: DC
  Administered 2014-07-12: 500 mL via INTRAVENOUS

## 2014-07-12 MED ORDER — CITRIC ACID-SODIUM CITRATE 334-500 MG/5ML PO SOLN: 30.0000 mL | ORAL | Status: DC | PRN

## 2014-07-12 MED ORDER — OXYTOCIN 40 UNITS IN LACTATED RINGERS INFUSION - SIMPLE MED
62.5000 mL/h | INTRAVENOUS | Status: DC
  Filled 2014-07-12: qty 1000

## 2014-07-12 MED ORDER — FENTANYL 2.5 MCG/ML BUPIVACAINE 1/10 % EPIDURAL INFUSION (WH - ANES)
14.0000 mL/h | INTRAMUSCULAR | Status: DC | PRN
  Administered 2014-07-12 (×2): 14 mL/h via EPIDURAL
  Filled 2014-07-12 (×2): qty 125

## 2014-07-12 MED ORDER — SIMETHICONE 80 MG PO CHEW: 80.0000 mg | CHEWABLE_TABLET | ORAL | Status: DC | PRN

## 2014-07-12 MED ORDER — INFLUENZA VAC SPLIT QUAD 0.5 ML IM SUSY
0.5000 mL | PREFILLED_SYRINGE | INTRAMUSCULAR | Status: DC
  Filled 2014-07-12: qty 0.5

## 2014-07-12 MED ORDER — WITCH HAZEL-GLYCERIN EX PADS: 1.0000 "application " | MEDICATED_PAD | CUTANEOUS | Status: DC | PRN

## 2014-07-12 MED ORDER — PRENATAL MULTIVITAMIN CH
1.0000 | ORAL_TABLET | Freq: Every day | ORAL | Status: DC
  Administered 2014-07-13 – 2014-07-14 (×2): 1 via ORAL
  Filled 2014-07-12 (×2): qty 1

## 2014-07-12 NOTE — H&P (Signed)
Renee [name] is a 43 y.o. female G38P1002 [redacted]w[redacted]d presenting for Induction re H/O Macrosomia with 3rd degree tear.  HPP/HPI:  Macrosomia per [place].  FMs pos.  No reg UC.  No Vaginal bleeding.  No AF leak.  No PIH Sx.   Past Medical History  Diagnosis Date  . HSV infection     Has never had an outbreak on valtrex  . Unspecified iridocyclitis     right eye  . Preglaucoma, unspecified     elevated IOP/  right eye  . AMA (advanced maternal age) multigravida 8+   . GERD (gastroesophageal reflux disease)   . Missed ab   . AMA (advanced maternal age) multigravida 35+    Past Surgical History  Procedure Laterality Date  . Cataract extraction w/ intraocular lens implant Right APRIL 2014  . Anal fissure repair  AGE 89  . Dilation and evacuation N/A 08/21/2013    Procedure: DILATATION AND EVACUATION;  Surgeon: Princess Bruins, MD;  Location: [facility];  Service: Gynecology;  Laterality: N/A;   Family History: family history includes Cancer in her father; Diabetes in her father and mother; Heart disease in her maternal uncle; Hypertension in her father and mother. Social History:  reports that she has never smoked. She has never used smokeless tobacco. She reports that she does not drink alcohol or use illicit drugs.  No Known Allergies  VE 3/50%/Vtx/-3/-2  AROM  Clear AF. FHR 130's accelerations present.  No deceleration. No UCs  Blood pressure 98/59, pulse 100, temperature 98.2 F (36.8 C), temperature source Oral, resp. rate 18, height 5\' 2"  (1.575 m), weight 93.895 kg (207 lb), last menstrual period 10/13/2013, SpO2 97.00%, unknown if currently breastfeeding. Exam Physical Exam  HPP:  Patient Active Problem List   Diagnosis Date Noted  . Pregnancy 07/12/2014  . Postpartum care following vaginal delivery (9/25) 07/12/2014    Prenatal labs: ABO, Rh: --/--/AB POS (09/25 1843) Antibody: NEG (09/25 1843) Rubella:  Immune RPR: NON REAC (09/25 0805)  HBsAg: Negative  (03/18 0000)  HIV: Non-reactive (03/18 0000)  Genetic testing:wnl [place] anato: wnl 1 hr GTT: wnl GBS: Positive (03/18 0000)   Assessment/Plan: 39+ wks Macrosomia with h/o 3rd degree tear for Induction.  AROM/Pito.  Epidural PRN.  Monitoring.   Kaison Mcparland,MARIE-LYNE 07/12/2014, 8:40 PM

## 2014-07-12 NOTE — Anesthesia Procedure Notes (Signed)
Epidural Patient location during procedure: OB Start time: 07/12/2014 9:04 AM  Staffing Anesthesiologist: Wilder Amodei A. Performed by: anesthesiologist   Preanesthetic Checklist Completed: patient identified, site marked, surgical consent, pre-op evaluation, timeout performed, IV checked, risks and benefits discussed and monitors and equipment checked  Epidural Patient position: sitting Prep: site prepped and draped and DuraPrep Patient monitoring: continuous pulse ox and blood pressure Approach: midline Location: L3-L4 Injection technique: LOR air  Needle:  Needle type: Tuohy  Needle gauge: 17 G Needle length: 9 cm and 9 Needle insertion depth: 6 cm Catheter type: closed end flexible Catheter size: 19 Gauge Catheter at skin depth: 11 cm Test dose: negative and Other  Assessment Events: blood not aspirated, injection not painful, no injection resistance, negative IV test and no paresthesia  Additional Notes Patient identified. Risks and benefits discussed including failed block, incomplete  Pain control, post dural puncture headache, nerve damage, paralysis, blood pressure Changes, nausea, vomiting, reactions to medications-both toxic and allergic and post Partum back pain. All questions were answered. Patient expressed understanding and wished to proceed. Sterile technique was used throughout procedure. Epidural site was Dressed with sterile barrier dressing. No paresthesias, signs of intravascular injection Or signs of intrathecal spread were encountered.  Patient was more comfortable after the epidural was dosed. Please see RN's note for documentation of vital signs and FHR which are stable.

## 2014-07-12 NOTE — Anesthesia Preprocedure Evaluation (Signed)
Anesthesia Evaluation  Patient identified by MRN, date of birth, ID band Patient awake    Reviewed: Allergy & Precautions, H&P , Patient's Chart, lab work & pertinent test results  Airway Mallampati: III TM Distance: >3 FB Neck ROM: Full    Dental no notable dental hx. (+) Teeth Intact   Pulmonary neg pulmonary ROS,  breath sounds clear to auscultation  Pulmonary exam normal       Cardiovascular negative cardio ROS  Rhythm:Regular Rate:Normal     Neuro/Psych Hx/o Iridocyclitis negative psych ROS   GI/Hepatic Neg liver ROS, GERD-  Medicated and Controlled,  Endo/Other  Morbid obesity  Renal/GU negative Renal ROS  negative genitourinary   Musculoskeletal negative musculoskeletal ROS (+)   Abdominal (+) + obese,   Peds negative pediatric ROS (+)  Hematology negative hematology ROS (+)   Anesthesia Other Findings   Reproductive/Obstetrics (+) Pregnancy AMA                           Anesthesia Physical Anesthesia Plan  ASA: III  Anesthesia Plan: Epidural   Post-op Pain Management:    Induction:   Airway Management Planned: Natural Airway  Additional Equipment:   Intra-op Plan:   Post-operative Plan:   Informed Consent: I have reviewed the patients History and Physical, chart, labs and discussed the procedure including the risks, benefits and alternatives for the proposed anesthesia with the patient or authorized representative who has indicated his/her understanding and acceptance.     Plan Discussed with: Anesthesiologist  Anesthesia Plan Comments:         Anesthesia Quick Evaluation

## 2014-07-13 LAB — CBC
HCT: 35 % — ABNORMAL LOW (ref 36.0–46.0)
Hemoglobin: 11.7 g/dL — ABNORMAL LOW (ref 12.0–15.0)
MCH: 30.9 pg (ref 26.0–34.0)
MCHC: 33.4 g/dL (ref 30.0–36.0)
MCV: 92.3 fL (ref 78.0–100.0)
Platelets: 281 10*3/uL (ref 150–400)
RBC: 3.79 MIL/uL — ABNORMAL LOW (ref 3.87–5.11)
RDW: 15 % (ref 11.5–15.5)
WBC: 15 10*3/uL — ABNORMAL HIGH (ref 4.0–10.5)

## 2014-07-13 NOTE — Lactation Note (Signed)
This note was copied from the chart of Renee [name]. Lactation Consultation Note  Patient Name: Renee [name] WNIOE'V Date: 07/13/2014 Reason for consult: Initial assessment  Initial visit.  Infant GA 39.6; 17 hrs old.  Infant has breastfed x8 (20-60 min); voids-0; stools-2 since birth.  Mom was holding infant upon entering room on couch.  Mom states she breastfed older child and used a nipple shield for the pain "which helped."  Mom currently c/o pain with this baby; nipples are pink in color and cracking noted on left nipple.  Mom asking [place] general questions.  LC offered assistance with latching and mom consented.  Mom latched infant in cross-cradle with a shallow latch.  As soon as infant latched, mom let go of support on breast and c/o pain.  LC assisted mom with latching using asymmetrical latching technique, attaining depth, and flanging bottom lip.  Taught hand expression with return demonstration and observation of drops of colostrum.  Infant fed for 15 min in a slow pattern and was falling asleep.  Gave comfort gels and explained how to use.  Educated on cluster feeding, size of infant's stomach, and feeding with feeding cues.  Lactation brochure given and informed of outpatient services, and hospital support group.  Encouraged mom to call for assistance as needed.     Maternal Data Formula Feeding for Exclusion: No Has patient been taught Hand Expression?: Yes Does the patient have breastfeeding experience prior to this delivery?: Yes  Feeding Feeding Type: Breast Fed Length of feed: 15 min  LATCH Score/Interventions Latch: Grasps breast easily, tongue down, lips flanged, rhythmical sucking. Intervention(s): Breast compression;Assist with latch;Adjust position  Audible Swallowing: A few with stimulation Intervention(s): Hand expression  Type of Nipple: Everted at rest and after stimulation  Comfort (Breast/Nipple): Filling, red/small blisters or bruises, mild/mod  discomfort  Problem noted: Mild/Moderate discomfort Interventions (Mild/moderate discomfort): Comfort gels  Hold (Positioning): Assistance needed to correctly position infant at breast and maintain latch. Intervention(s): Breastfeeding basics reviewed;Support Pillows;Skin to skin  LATCH Score: 7  Lactation Tools Discussed/Used     Consult Status Consult Status: Follow-up Date: 07/14/14 Follow-up type: In-patient    Merlene Laughter 07/13/2014, 2:20 PM

## 2014-07-13 NOTE — Progress Notes (Signed)
Mother refused hepatitis b vaccine in hospital.

## 2014-07-13 NOTE — Anesthesia Postprocedure Evaluation (Signed)
  Anesthesia Post-op Note  Patient: Renee [name]  Procedure(s) Performed: * No procedures listed *  Patient Location: Mother/Baby  Anesthesia Type:Epidural  Level of Consciousness: awake and alert   Airway and Oxygen Therapy: Patient Spontanous Breathing  Post-op Pain: mild  Post-op Assessment: Post-op Vital signs reviewed, Patient's Cardiovascular Status Stable, Respiratory Function Stable, No signs of Nausea or vomiting, Pain level controlled, No headache, No residual numbness and No residual motor weakness  Post-op Vital Signs: Reviewed  Last Vitals:  Filed Vitals:   07/13/14 0950  BP: 98/51  Pulse: 75  Temp: 36.7 C  Resp: 20    Complications: No apparent anesthesia complications

## 2014-07-13 NOTE — Progress Notes (Signed)
Patient ID: Renee [name], female   DOB: [birth year], 43 y.o.   MRN: [id number] PPD # 1 SVD  S:  Reports feeling very tired from baby cluster feeding all night, but well             Tolerating po/ No nausea or vomiting             Bleeding is light             Pain controlled with ibuprofen (OTC)             Up ad lib / ambulatory / voiding without difficulties    Newborn  Information for the patient's newbornAnastacia, [name] [[id number]]  female  breast feeding    O:  A & O x 3, in no apparent distress              VS:  Filed Vitals:   07/12/14 2045 07/12/14 2115 07/12/14 2220 07/13/14 0220  BP: 116/71 131/68 123/61 115/67  Pulse: 105 92 88 82  Temp:  97.6 F (36.4 C) 98.1 F (36.7 C) 97.5 F (36.4 C)  TempSrc:  Oral Oral Oral  Resp:  18 18 18   Height:      Weight:      SpO2:        LABS:  Recent Labs  07/12/14 0805 07/13/14 0602  WBC 12.0* 15.0*  HGB 12.5 11.7*  HCT 37.5 35.0*  PLT 300 281    Blood type: --/--/AB POS (09/25 1843)  Rubella: Immune (03/18 0000)   I&O: I/O last 3 completed shifts: In: -  Out: 1100 [Urine:800; Blood:300]             Lungs: Clear and unlabored  Heart: regular rate and rhythm / no murmurs  Abdomen: soft, non-tender, non-distended              Fundus: firm, non-tender, U-1  Perineum: 2nd degree repair healing well  Lochia: minimal  Extremities: 1+ edema, no calf pain or tenderness, no Homans    A/P: PPD # 1  43 y.o., [id number]   Principal Problem:    Postpartum care following vaginal delivery (9/25)   Doing well - stable status  Routine post partum orders  Anticipate discharge tomorrow    Laury Deep, M, MSN, CNM 07/13/2014, 8:20 AM

## 2014-07-14 DIAGNOSIS — M79609 Pain in unspecified limb: Secondary | ICD-10-CM

## 2014-07-14 MED ORDER — OXYCODONE-ACETAMINOPHEN 5-325 MG PO TABS: 1.0000 | ORAL_TABLET | ORAL | Status: DC | PRN

## 2014-07-14 MED ORDER — IBUPROFEN 600 MG PO TABS: 600.0000 mg | ORAL_TABLET | Freq: Four times a day (QID) | ORAL | Status: DC

## 2014-07-14 NOTE — Lactation Note (Addendum)
This note was copied from the chart of Renee [name]. Lactation Consultation Note  Patient Name: Renee [name] DPOEU'M Date: 07/14/2014 Reason for consult: Follow-up assessment Baby is presently feeding on the left breast . Per mom has some intermittent discomfort. Duncan asked if she wanted to release baby from the breast and per mom the discomfort isn't that bad. Per mom already using the comfort gels , LC instructed on the use shells and shoed mom how to use them .  Per mom mentioned her nipples felt better with them in. LC noted after the baby released swollen areolas,  which probably is the cause of her sore nipples. LC noted a horizontal positional strip on the right nipple and the  Left nipple pinky red , no breakdown noted. Due to swelling at the base of nipple is causing the baby to slide away  From mom and loose depth at the breast. LC highly suggested to mom when latching to use the breast compression Technique until the baby is in a swallowing pattern and then intermittent during the feeding. Also reviewed basics of latching  Breast massage, and expressing , pre-pumping if needed to make the nipple areola junction more elastic and reverse pressure, Latch with breast compressions until the baby is in a consistent swallowing pattern and intermittent. Instructed mom on the hand pump, shells, and per mom has a DEBP Medela at home. Per mom the baby didn't like the nipple shield. LC noted the baby to be latched with depth without the nipple shield with multiply swallows , increased with breast compressions.  Mother informed of post-discharge support and given phone number to the lactation department, including services for phone call assistance;  out-patient appointments; and breastfeeding support group. List of other breastfeeding resources in the community given in the handout.  Encouraged mother to call for problems or concerns related to breastfeeding.   Maternal Data Has patient  been taught Hand Expression?: Yes  Feeding Feeding Type:  (already latched with swallows ) Length of feed: 30 min (LC observed baby feeding after latched )  LATCH Score/Interventions Latch:  (latched with depth )  Audible Swallowing:  (swallows noted )  Type of Nipple:  (nipple appeared normal when baby released )  Comfort (Breast/Nipple):  (per mom some discomfort intermittent , LC noted some swelling at the base of the nipple )  Problem noted: Filling  Hold (Positioning):  (mom independent with latch ) Intervention(s): Breastfeeding basics reviewed (see LC note )     Lactation Tools Discussed/Used Tools: Shells;Pump;Comfort gels Nipple shield size:  (per mom baby didin't like it ) Shell Type: Inverted Breast pump type: Manual Pump Review: Setup, frequency, and cleaning;Milk Storage Initiated by:: MAI  Date initiated:: 07/14/14   Consult Status Consult Status: Complete Date: 07/14/14    Myer Haff 07/14/2014, 12:40 PM

## 2014-07-14 NOTE — Discharge Summary (Signed)
Obstetric Discharge Summary  Reason for Admission: Pt is a G2P2002 at [redacted]w[redacted]d for IOL for macrosomia and h/o 3rd degree laceration  Patient has received care at [facility] since 12.4 wks, with Dr. Dellis Filbert as primary provider.  Medications on Admission: Prescriptions prior to admission  Medication Sig Dispense Refill  . brimonidine-timolol (COMBIGAN) 0.2-0.5 % ophthalmic solution Place 1 drop into the right eye 2 (two) times daily.      . calcium carbonate (TUMS - DOSED IN MG ELEMENTAL CALCIUM) 500 MG chewable tablet Chew 2 tablets by mouth daily as needed for indigestion or heartburn.      . moxifloxacin (VIGAMOX) 0.5 % ophthalmic solution Place 1 drop into the right eye 2 (two) times daily.      . Prenatal Vit-Fe Fumarate-FA (PRENATAL MULTIVITAMIN) TABS tablet Take 1 tablet by mouth daily at 12 noon.      . valACYclovir (VALTREX) 500 MG tablet Take 500 mg by mouth daily.        Intrapartum Course:  Admitted for IOL for macrosomia and h/o 3rd degree laceration / normal progression to complete dilation / spontaneous vaginal delivery of viable female with a 2nd degree perineal repair by Dr. Dellis Filbert / (+) Homan's sign in RT calf with Negative Doppler study  Prenatal Labs: ABO, Rh: AB POS (09/25 1843)  Antibody: NEG (09/25 1843) Rubella: Immune (03/18 0000)   RPR: NON REAC (09/25 0805)  HBsAg: Negative (03/18 0000)  HIV: Non-reactive (03/18 0000)  GTT : Normal GBS: Positive (03/18 0000)   Prenatal Procedures: NST and ultrasound Intrapartum Procedures: spontaneous vaginal delivery Postpartum Procedures: none Complications-Operative and Postpartum: 2nd degree perineal laceration  Labs: Hemoglobin  Date Value Ref Range Status  07/13/2014 11.7* 12.0 - 15.0 g/dL Final     HCT  Date Value Ref Range Status  07/13/2014 35.0* 36.0 - 46.0 % Final   Lab Results  Component Value Date   PLT 281 07/13/2014    Newborn Data: Live born female  Birth Weight: 9 lb 0.5 oz (4095 g) APGAR:  8, 9  Home with mother.   Discharge Information: Date: 07/14/2014 Discharge Diagnoses:  Pt is a G2P2002 at [redacted]w[redacted]d S/P Term Pregnancy-delivered on 07/12/2014  Condition: stable Activity: pelvic rest Diet: routine Medications:    Medication List         calcium carbonate 500 MG chewable tablet  Commonly known as:  TUMS - dosed in mg elemental calcium  Chew 2 tablets by mouth daily as needed for indigestion or heartburn.     COMBIGAN 0.2-0.5 % ophthalmic solution  Generic drug:  brimonidine-timolol  Place 1 drop into the right eye 2 (two) times daily.     ibuprofen 600 MG tablet  Commonly known as:  ADVIL,MOTRIN  Take 1 tablet (600 mg total) by mouth every 6 (six) hours.     moxifloxacin 0.5 % ophthalmic solution  Commonly known as:  VIGAMOX  Place 1 drop into the right eye 2 (two) times daily.     oxyCODONE-acetaminophen 5-325 MG per tablet  Commonly known as:  PERCOCET/ROXICET  Take 1 tablet by mouth every 4 (four) hours as needed (for pain scale less than 7).     prenatal multivitamin Tabs tablet  Take 1 tablet by mouth daily at 12 noon.     valACYclovir 500 MG tablet  Commonly known as:  VALTREX  Take 500 mg by mouth daily.       Instructions: [facility] instruction booklet has been given and reviewed Discharge to:  home   [name], MSN, CNM 07/14/2014, 10:54 AM

## 2014-07-14 NOTE — Progress Notes (Addendum)
Patient ID: Renee [name], female   DOB: [birth year], 43 y.o.   MRN: [id number] Post Partum Day #2            Information for the patient's newborn:  Renee [name], Renee [name] [[id number]]  female  Feeding: breast and bottle  Subjective: No HA, SOB, CP, F/C, breast symptoms. Pain well-controlled with ibuprofen. Normal vaginal bleeding, no clots. Recent pain in Rt calf muscle - "feels like a pulled muscle"      Objective:  Temp:  [97.5 F (36.4 C)-98.1 F (36.7 C)] 97.6 F (36.4 C) (09/27 0639) Pulse Rate:  [75-84] 79 (09/27 0639) Resp:  [18-20] 18 (09/27 0639) BP: (98-130)/(51-63) 98/51 mmHg (09/27 0639) SpO2:  [96 %] 96 % (09/27 0639)    Recent Labs  07/12/14 0805 07/13/14 0602  WBC 12.0* 15.0*  HGB 12.5 11.7*  HCT 37.5 35.0*  PLT 300 281    Blood type: AB POS (09/25 1843) Rubella: Immune (03/18 0000)    Physical Exam:  General: alert, cooperative and no distress Uterine Fundus: firm Lochia: appropriate Perineum: 2nd degree repair healing well, edema no DVT Evaluation: Posterior calf tenderness present / (+) Homan's sign.      Calf/Ankle 1+ edema is present / Rt calf measures 14.75", Lt calf measures 15" / no redness or cords   Assessment/Plan: PPD # 2 / 43 y.o., [id number] S/P: spontaneous vaginal with 2nd degree perineal laceration   Principal Problem:     Postpartum care following vaginal delivery (9/25)    Normal postpartum exam  Continue current postpartum care  Doppler scan of RT calf  D/C home  *Dr. Dellis Filbert notified of assessment / recommend doppler study of RT calf   LOS: 2 days   Renee [name], Renee [name], M, MSN, CNM 07/14/2014, 8:54 AM   **Addendum: Doppler study done on Rt calf - negative results / Dr. Dellis Filbert notified and agrees to proceed with d/c - Dr. Dellis Filbert will call the patient   Renee Congress MSN, CNM 07/14/2014  11:05 AM

## 2014-07-14 NOTE — Progress Notes (Signed)
Patient in the shower, notified husband that doctor ordered a doppler study. We don't know what time it will be completed yet.

## 2014-07-14 NOTE — Progress Notes (Signed)
*  Preliminary Results* Right lower extremity venous duplex completed. Right lower extremity is negative for deep vein thrombosis. There is no evidence of right Baker's cyst.  07/14/2014 10:34 AM  Maudry Mayhew, RVT, RDCS, RDMS

## 2014-08-19 ENCOUNTER — Encounter (HOSPITAL_COMMUNITY): Payer: Self-pay

## 2014-11-25 ENCOUNTER — Other Ambulatory Visit: Payer: Self-pay | Admitting: Physician Assistant

## 2015-08-21 ENCOUNTER — Encounter: Payer: Self-pay | Admitting: Family Medicine

## 2015-08-21 ENCOUNTER — Ambulatory Visit (INDEPENDENT_AMBULATORY_CARE_PROVIDER_SITE_OTHER): Payer: 59 | Admitting: Family Medicine

## 2015-08-21 VITALS — BP 100/70 | Temp 98.3°F | Ht 63.0 in | Wt 174.0 lb

## 2015-08-21 DIAGNOSIS — H209 Unspecified iridocyclitis: Secondary | ICD-10-CM

## 2015-08-21 DIAGNOSIS — Z0000 Encounter for general adult medical examination without abnormal findings: Secondary | ICD-10-CM

## 2015-08-21 DIAGNOSIS — K219 Gastro-esophageal reflux disease without esophagitis: Secondary | ICD-10-CM | POA: Insufficient documentation

## 2015-08-21 DIAGNOSIS — Z800 Family history of malignant neoplasm of digestive organs: Secondary | ICD-10-CM | POA: Insufficient documentation

## 2015-08-21 DIAGNOSIS — M79675 Pain in left toe(s): Secondary | ICD-10-CM

## 2015-08-21 NOTE — Progress Notes (Signed)
Renee Reddish, MD Phone: [phone]  Subjective:  Patient presents today to establish care. Chief complaint-noted.   See problem oriented charting  The following were reviewed and entered/updated in epic: Past Medical History  Diagnosis Date  . HSV infection     Has never had an outbreak on valtrex, fever blister  . Unspecified iridocyclitis     right eye  . Preglaucoma, unspecified     elevated IOP/  right eye  . GERD (gastroesophageal reflux disease)     Tums prn, worse in pregnancy  . Miscarriage   . Uveitis    Patient Active Problem List   Diagnosis Date Noted  . Uveitis     Priority: High  . Family history of colon cancer 08/21/2015    Priority: Low  . GERD (gastroesophageal reflux disease)     Priority: Low   Past Surgical History  Procedure Laterality Date  . Cataract extraction w/ intraocular lens implant Right APRIL 2014  . Anal fissure repair  AGE 45  . Dilation and evacuation N/A 08/21/2013    miscarriage    Family History  Problem Relation Age of Onset  . Diabetes Mother   . Hypertension Mother   . Diabetes Father   . Hypertension Father   . Cancer Father     Colon 32, lung- former smoker 59  . Hyperlipidemia Father     mother    Medications- reviewed and updated Current Outpatient Prescriptions  Medication Sig Dispense Refill  . brimonidine-timolol (COMBIGAN) 0.2-0.5 % ophthalmic solution Place 1 drop into the right eye 2 (two) times daily.    . calcium carbonate (TUMS - DOSED IN MG ELEMENTAL CALCIUM) 500 MG chewable tablet Chew 2 tablets by mouth daily as needed for indigestion or heartburn.    . loteprednol (LOTEMAX) 0.5 % ophthalmic suspension      No current facility-administered medications for this visit.    Allergies-reviewed and updated No Known Allergies  Social History   Social History  . Marital Status: Married    Spouse Name: N/A  . Number of Children: N/A  . Years of Education: N/A   Social History Main Topics  .  Smoking status: Never Smoker   . Smokeless tobacco: Never Used  . Alcohol Use: No  . Drug Use: No  . Sexual Activity: Yes   Other Topics Concern  . None   Social History Narrative   Married. 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 Goes for walks with kids. Enjoys shopping/spending money.              ROS--Full ROS was completed and negative except for eye issues noted in HPI, left toe pain, occasional reflux  Objective: BP 100/70 mmHg  Temp(Src) 98.3 F (36.8 C)  Ht '5\' 3"'  (1.6 m)  Wt 174 lb (78.926 kg)  BMI 30.83 kg/m2 Gen: NAD, resting comfortably HEENT: Mucous membranes are moist. Oropharynx normal. TM normal. Eyes: sclera and lids normal, PERRLA Neck: no thyromegaly, no cervical lymphadenopathy CV: RRR no murmurs rubs or gallops Lungs: CTAB no crackles, wheeze, rhonchi Abdomen: soft/nontender/nondistended/normal bowel sounds. No rebound or guarding.  Declines GU and breast exam- will complete with gyn Ext: no edema Skin: warm, dry Neuro: 5/5 strength in upper and lower extremities, normal gait, normal reflexes  Assessment/Plan:  44 y.o. female presenting for annual physical.  Health Maintenance counseling: 1. Anticipatory guidance: Patient counseled regarding regular dental exams, wearing seatbelts.  2. Risk factor reduction:  Advised patient of need for regular exercise and diet rich and fruits and vegetables to reduce risk of heart attack and stroke.  3. Immunizations/screenings/ancillary studies 4. Cervical cancer screening- will have records sent from upcoming exam with GYN 5. Breast cancer screening-  breast exam planned with GYN as well as mammogram  6. Colon cancer screening - earlier this year with Dr. Collene Mares due to family history- 5 year plan  GERD (gastroesophageal reflux disease) S:Tums as  needed. When eats healthy -not much of issue with GERD A/P: continue healthy diet, work on weigh tloss. Tums prn while working on this   Uveitis S: Paitent states she has a "lazy eye" on left which has never had good vision. A few years ago noted intense pain in right eye. Diagnosed with Episcleritis and later uveitis. Cataract surgery as a result of the medication. Currently on combigan and lotemax. Optho left town and is establishing with new. She has an appointment in January. There was concern this could be related to autoimmune disease and had prior workup. Request repeat of this as was told to have yearly. HLA b27 was done due to some back pain and had films as well previously.  A/P:encouraged optho follow up as planned. We will check RF, ANA, anca panel, hla b27, CRP, ESR.   Family history of colon cancer Colonoscopy 2016 Dr. Collene Mares. q 5 years- requesting records   Left Great toe pain S:Entire left great toe at IP joint and beyond was painful- put on colchicine and round or steroids. Less meat and seafoodand has not had recurrence. Seems to flare if worsens.  A/P: check uric acid to see if gout related  Sees Dr. Dellis Filbert next week- plans for mammogram and pelvic exam- declines today Update bloodwork- lipids, cbc, cmet, tsh, uric acid  1 year CPE/follow up unless labs necessitate otherwise  Future fasting Orders Placed This Encounter  Procedures  . CBC    [place]    Standing Status: Future     Number of Occurrences:      Standing Expiration Date: 08/20/2016  . Comprehensive metabolic panel    [place]    Standing Status: Future     Number of Occurrences:      Standing Expiration Date: 08/20/2016    Order Specific Question:  Has the patient fasted?    Answer:  No  . Lipid panel    [place]    Standing Status: Future     Number of Occurrences:      Standing Expiration Date: 08/20/2016    Order Specific Question:  Has the patient fasted?    Answer:  No  . TSH    [place]     Standing Status: Future     Number of Occurrences:      Standing Expiration Date: 08/20/2016  . Uric Acid    Standing Status: Future     Number of Occurrences:      Standing Expiration Date: 08/20/2016  . Rheumatoid Factor    Standing Status: Future     Number of Occurrences:      Standing Expiration Date: 08/20/2016  . ANA    Standing Status: Future     Number of Occurrences:      Standing Expiration Date: 08/20/2016  . ANCA Screen Reflex Titer    Standing Status: Future  Number of Occurrences:      Standing Expiration Date: 08/20/2016  . HLA-B27 Antigen    Standing Status: Future     Number of Occurrences:      Standing Expiration Date: 08/20/2016  . C-reactive Protein    Standing Status: Future     Number of Occurrences:      Standing Expiration Date: 08/20/2016  . Sedimentation rate    [place]    Standing Status: Future     Number of Occurrences:      Standing Expiration Date: 08/20/2016

## 2015-08-21 NOTE — Patient Instructions (Addendum)
Sign release of information at the front desk for records from Dr. Collene Mares  Schedule a lab visit at the front desk for future fasting labs. Nothing but water after midnight please.   Recommend regular exercise. Within 1 year would love to see about 20 lbs weight loss. I understand this is hard with 2 kids but I believe you can do this!   Definitely keep optho follow up  Have Dr. Dellis Filbert send me records please

## 2015-08-21 NOTE — Assessment & Plan Note (Signed)
S:Tums as needed. When eats healthy -not much of issue with GERD A/P: continue healthy diet, work on weigh tloss. Tums prn while working on this

## 2015-08-21 NOTE — Assessment & Plan Note (Signed)
S: Paitent states she has a "lazy eye" on left which has never had good vision. A few years ago noted intense pain in right eye. Diagnosed with Episcleritis and later uveitis. Cataract surgery as a result of the medication. Currently on combigan and lotemax. Optho left town and is establishing with new. She has an appointment in January. There was concern this could be related to autoimmune disease and had prior workup. Request repeat of this as was told to have yearly. HLA b27 was done due to some back pain and had films as well previously.  A/P:encouraged optho follow up as planned. We will check RF, ANA, anca panel, hla b27, CRP, ESR.

## 2015-08-21 NOTE — Assessment & Plan Note (Signed)
Colonoscopy 2016 Dr. Collene Mares. q 5 years- requesting records

## 2015-08-28 ENCOUNTER — Other Ambulatory Visit (INDEPENDENT_AMBULATORY_CARE_PROVIDER_SITE_OTHER): Payer: 59

## 2015-08-28 DIAGNOSIS — M79675 Pain in left toe(s): Secondary | ICD-10-CM | POA: Diagnosis not present

## 2015-08-28 DIAGNOSIS — Z0000 Encounter for general adult medical examination without abnormal findings: Secondary | ICD-10-CM

## 2015-08-28 DIAGNOSIS — H209 Unspecified iridocyclitis: Secondary | ICD-10-CM | POA: Diagnosis not present

## 2015-08-28 LAB — COMPREHENSIVE METABOLIC PANEL
ALT: 10 U/L (ref 0–35)
AST: 12 U/L (ref 0–37)
Albumin: 4.4 g/dL (ref 3.5–5.2)
Alkaline Phosphatase: 45 U/L (ref 39–117)
BUN: 19 mg/dL (ref 6–23)
CO2: 26 mEq/L (ref 19–32)
Calcium: 9.6 mg/dL (ref 8.4–10.5)
Chloride: 104 mEq/L (ref 96–112)
Creatinine, Ser: 0.67 mg/dL (ref 0.40–1.20)
GFR: 102.39 mL/min (ref >60.00)
Glucose, Bld: 108 mg/dL — ABNORMAL HIGH (ref 70–99)
Potassium: 4.2 mEq/L (ref 3.5–5.1)
Sodium: 138 mEq/L (ref 135–145)
Total Bilirubin: 0.6 mg/dL (ref 0.2–1.2)
Total Protein: 7 g/dL (ref 6.0–8.3)

## 2015-08-28 LAB — CBC
HCT: 37.8 % (ref 36.0–46.0)
Hemoglobin: 12.6 g/dL (ref 12.0–15.0)
MCHC: 33.3 g/dL (ref 30.0–36.0)
MCV: 87.6 fl (ref 78.0–100.0)
Platelets: 297 10*3/uL (ref 150.0–400.0)
RBC: 4.32 Mil/uL (ref 3.87–5.11)
RDW: 13.1 % (ref 11.5–15.5)
WBC: 6.5 10*3/uL (ref 4.0–10.5)

## 2015-08-28 LAB — LIPID PANEL
Cholesterol: 212 mg/dL — ABNORMAL HIGH (ref 0–200)
HDL: 41.6 mg/dL (ref >39.00)
LDL Cholesterol: 154 mg/dL — ABNORMAL HIGH (ref 0–99)
NonHDL: 170.84
Total CHOL/HDL Ratio: 5
Triglycerides: 86 mg/dL (ref 0.0–149.0)
VLDL: 17.2 mg/dL (ref 0.0–40.0)

## 2015-08-28 LAB — TSH: TSH: 1.96 u[IU]/mL (ref 0.35–4.50)

## 2015-08-28 LAB — C-REACTIVE PROTEIN: CRP: 0.2 mg/dL — ABNORMAL LOW (ref 0.5–20.0)

## 2015-08-28 LAB — URIC ACID: Uric Acid, Serum: 3.4 mg/dL (ref 2.4–7.0)

## 2015-08-28 LAB — SEDIMENTATION RATE: Sed Rate: 18 mm/hr (ref 0–22)

## 2015-08-28 LAB — RHEUMATOID FACTOR: Rhuematoid fact SerPl-aCnc: <10 IU/mL (ref <=14)

## 2015-08-29 LAB — ANCA SCREEN W REFLEX TITER: ANCA Screen: NEGATIVE

## 2015-08-29 LAB — ANA: Anti Nuclear Antibody(ANA): NEGATIVE

## 2015-08-29 LAB — HLA-B27 ANTIGEN: DNA Result:: NOT DETECTED

## 2015-10-21 DIAGNOSIS — H40051 Ocular hypertension, right eye: Secondary | ICD-10-CM | POA: Diagnosis not present

## 2015-10-21 DIAGNOSIS — H15101 Unspecified episcleritis, right eye: Secondary | ICD-10-CM | POA: Diagnosis not present

## 2015-10-21 DIAGNOSIS — H15001 Unspecified scleritis, right eye: Secondary | ICD-10-CM | POA: Diagnosis not present

## 2015-10-21 DIAGNOSIS — H53002 Unspecified amblyopia, left eye: Secondary | ICD-10-CM | POA: Diagnosis not present

## 2015-10-21 DIAGNOSIS — Z961 Presence of intraocular lens: Secondary | ICD-10-CM | POA: Insufficient documentation

## 2015-10-24 DIAGNOSIS — Z8619 Personal history of other infectious and parasitic diseases: Secondary | ICD-10-CM | POA: Diagnosis not present

## 2015-11-19 DIAGNOSIS — Z36 Encounter for antenatal screening of mother: Secondary | ICD-10-CM | POA: Diagnosis not present

## 2015-12-08 DIAGNOSIS — R102 Pelvic and perineal pain: Secondary | ICD-10-CM | POA: Diagnosis not present

## 2015-12-08 DIAGNOSIS — Z3A18 18 weeks gestation of pregnancy: Secondary | ICD-10-CM | POA: Diagnosis not present

## 2015-12-08 DIAGNOSIS — O09522 Supervision of elderly multigravida, second trimester: Secondary | ICD-10-CM | POA: Diagnosis not present

## 2015-12-24 DIAGNOSIS — Z3A21 21 weeks gestation of pregnancy: Secondary | ICD-10-CM | POA: Diagnosis not present

## 2015-12-24 DIAGNOSIS — O09522 Supervision of elderly multigravida, second trimester: Secondary | ICD-10-CM | POA: Diagnosis not present

## 2016-02-18 DIAGNOSIS — Z36 Encounter for antenatal screening of mother: Secondary | ICD-10-CM | POA: Diagnosis not present

## 2016-02-18 DIAGNOSIS — Z23 Encounter for immunization: Secondary | ICD-10-CM | POA: Diagnosis not present

## 2016-02-22 ENCOUNTER — Encounter (HOSPITAL_COMMUNITY): Payer: Self-pay | Admitting: Certified Nurse Midwife

## 2016-02-22 ENCOUNTER — Inpatient Hospital Stay (HOSPITAL_COMMUNITY)
Admission: AD | Admit: 2016-02-22 | Discharge: 2016-02-22 | Disposition: A | Discharge status: Home / Self Care | Payer: 59 | Source: Ambulatory Visit | Attending: Obstetrics and Gynecology | Admitting: Obstetrics and Gynecology

## 2016-02-22 DIAGNOSIS — O26899 Other specified pregnancy related conditions, unspecified trimester: Secondary | ICD-10-CM

## 2016-02-22 DIAGNOSIS — K219 Gastro-esophageal reflux disease without esophagitis: Secondary | ICD-10-CM | POA: Diagnosis not present

## 2016-02-22 DIAGNOSIS — M549 Dorsalgia, unspecified: Secondary | ICD-10-CM | POA: Diagnosis not present

## 2016-02-22 DIAGNOSIS — O26893 Other specified pregnancy related conditions, third trimester: Secondary | ICD-10-CM | POA: Insufficient documentation

## 2016-02-22 DIAGNOSIS — R1030 Lower abdominal pain, unspecified: Secondary | ICD-10-CM | POA: Diagnosis not present

## 2016-02-22 DIAGNOSIS — Z3A29 29 weeks gestation of pregnancy: Secondary | ICD-10-CM | POA: Insufficient documentation

## 2016-02-22 DIAGNOSIS — O9989 Other specified diseases and conditions complicating pregnancy, childbirth and the puerperium: Secondary | ICD-10-CM

## 2016-02-22 DIAGNOSIS — R109 Unspecified abdominal pain: Secondary | ICD-10-CM | POA: Diagnosis not present

## 2016-02-22 LAB — URINALYSIS, ROUTINE W REFLEX MICROSCOPIC
Bilirubin Urine: NEGATIVE
Glucose, UA: NEGATIVE mg/dL
Hgb urine dipstick: NEGATIVE
Ketones, ur: NEGATIVE mg/dL
Leukocytes, UA: NEGATIVE
Nitrite: NEGATIVE
Protein, ur: NEGATIVE mg/dL
Specific Gravity, Urine: 1.01 (ref 1.005–1.030)
pH: 6.5 (ref 5.0–8.0)

## 2016-02-22 NOTE — MAU Note (Signed)
Pt states she started at [facility] with sharp shooting pains in her abdomen. Pt denies ctxs, LOF, or vaginal bleeding. +FM.

## 2016-02-22 NOTE — Discharge Instructions (Signed)

## 2016-02-22 NOTE — MAU Provider Note (Signed)
History   G3P2 @ 29.5 wks in with low abd pain sharp shooting in nature that has been going on for several days. Denies ROM or vag bleeding.   CSN: [id number]  Arrival date & time 02/22/16  1427   None     Chief Complaint  Patient presents with  . Abdominal Pain    HPI  Past Medical History  Diagnosis Date  . HSV infection     Has never had an outbreak on valtrex, fever blister  . Unspecified iridocyclitis     right eye  . Preglaucoma, unspecified     elevated IOP/  right eye  . GERD (gastroesophageal reflux disease)     Tums prn, worse in pregnancy  . Miscarriage   . Uveitis     Past Surgical History  Procedure Laterality Date  . Cataract extraction w/ intraocular lens implant Right APRIL 2014  . Anal fissure repair  AGE 45  . Dilation and evacuation N/A 08/21/2013    miscarriage    Family History  Problem Relation Age of Onset  . Diabetes Mother   . Hypertension Mother   . Diabetes Father   . Hypertension Father   . Cancer Father     Colon 30, lung- former smoker 46  . Hyperlipidemia Father     mother    Social History  Substance Use Topics  . Smoking status: Never Smoker   . Smokeless tobacco: Never Used  . Alcohol Use: No    OB History    Gravida Para Term Preterm AB TAB SAB Ectopic Multiple Living   3 2 2  0 0 0 0 0 0 2      Review of Systems  Constitutional: Negative.   HENT: Negative.   Eyes: Negative.   Respiratory: Negative.   Gastrointestinal: Positive for abdominal pain.  Endocrine: Negative.   Genitourinary: Negative.   Musculoskeletal: Positive for back pain.  Skin: Negative.   Allergic/Immunologic: Negative.   Neurological: Negative.   Hematological: Negative.   Psychiatric/Behavioral: Negative.     Allergies  Review of patient's allergies indicates no known allergies.  Home Medications  No current outpatient prescriptions on file.  BP 120/75 mmHg  Pulse 100  Temp(Src) 98.2 F (36.8 C) (Oral)  Resp 20  Ht 5' 2.5"  (1.588 m)  Wt 190 lb (86.183 kg)  BMI 34.18 kg/m2  Physical Exam  Constitutional: She is oriented to person, place, and time. She appears well-developed and well-nourished.  HENT:  Head: Normocephalic.  Eyes: Pupils are equal, round, and reactive to light.  Neck: Normal range of motion.  Cardiovascular: Normal rate, regular rhythm, normal heart sounds and intact distal pulses.   Pulmonary/Chest: Effort normal and breath sounds normal.  Abdominal: Soft. Bowel sounds are normal.  Genitourinary: Vagina normal and uterus normal.  Musculoskeletal: Normal range of motion.  Neurological: She is alert and oriented to person, place, and time. She has normal reflexes.  Skin: Skin is warm and dry.  Psychiatric: She has a normal mood and affect. Her behavior is normal. Judgment and thought content normal.    MAU Course  Procedures (including critical care time)  Labs Reviewed  URINALYSIS, [place] (NOT AT [facility])   No results found.   No diagnosis found.    MDM  ABD pain in preg Round lig pain SVE firm/cl/post/high urine neg. Spoke with Dr. Ronita Hipps ok pt for discharge

## 2016-02-24 DIAGNOSIS — H40051 Ocular hypertension, right eye: Secondary | ICD-10-CM | POA: Diagnosis not present

## 2016-02-24 DIAGNOSIS — H15101 Unspecified episcleritis, right eye: Secondary | ICD-10-CM | POA: Diagnosis not present

## 2016-02-24 DIAGNOSIS — H35372 Puckering of macula, left eye: Secondary | ICD-10-CM | POA: Insufficient documentation

## 2016-02-24 DIAGNOSIS — H15001 Unspecified scleritis, right eye: Secondary | ICD-10-CM | POA: Diagnosis not present

## 2016-02-24 DIAGNOSIS — H53002 Unspecified amblyopia, left eye: Secondary | ICD-10-CM | POA: Diagnosis not present

## 2016-02-24 DIAGNOSIS — Z961 Presence of intraocular lens: Secondary | ICD-10-CM | POA: Diagnosis not present

## 2016-03-10 DIAGNOSIS — O09523 Supervision of elderly multigravida, third trimester: Secondary | ICD-10-CM | POA: Diagnosis not present

## 2016-03-10 DIAGNOSIS — Z3A32 32 weeks gestation of pregnancy: Secondary | ICD-10-CM | POA: Diagnosis not present

## 2016-03-18 DIAGNOSIS — O09523 Supervision of elderly multigravida, third trimester: Secondary | ICD-10-CM | POA: Diagnosis not present

## 2016-03-18 DIAGNOSIS — Z3A33 33 weeks gestation of pregnancy: Secondary | ICD-10-CM | POA: Diagnosis not present

## 2016-03-31 DIAGNOSIS — O09523 Supervision of elderly multigravida, third trimester: Secondary | ICD-10-CM | POA: Diagnosis not present

## 2016-03-31 DIAGNOSIS — Z3A35 35 weeks gestation of pregnancy: Secondary | ICD-10-CM | POA: Diagnosis not present

## 2016-03-31 DIAGNOSIS — Z36 Encounter for antenatal screening of mother: Secondary | ICD-10-CM | POA: Diagnosis not present

## 2016-04-07 DIAGNOSIS — Z3A36 36 weeks gestation of pregnancy: Secondary | ICD-10-CM | POA: Diagnosis not present

## 2016-04-07 DIAGNOSIS — O09523 Supervision of elderly multigravida, third trimester: Secondary | ICD-10-CM | POA: Diagnosis not present

## 2016-04-14 DIAGNOSIS — O09523 Supervision of elderly multigravida, third trimester: Secondary | ICD-10-CM | POA: Diagnosis not present

## 2016-04-14 DIAGNOSIS — Z3A37 37 weeks gestation of pregnancy: Secondary | ICD-10-CM | POA: Diagnosis not present

## 2016-04-15 ENCOUNTER — Other Ambulatory Visit: Payer: Self-pay | Admitting: Obstetrics & Gynecology

## 2016-04-21 DIAGNOSIS — Z3A38 38 weeks gestation of pregnancy: Secondary | ICD-10-CM | POA: Diagnosis not present

## 2016-04-21 DIAGNOSIS — O09523 Supervision of elderly multigravida, third trimester: Secondary | ICD-10-CM | POA: Diagnosis not present

## 2016-04-22 ENCOUNTER — Encounter (HOSPITAL_COMMUNITY): Payer: Self-pay

## 2016-04-22 ENCOUNTER — Inpatient Hospital Stay (HOSPITAL_COMMUNITY): Payer: 59 | Admitting: Certified Registered Nurse Anesthetist

## 2016-04-22 ENCOUNTER — Inpatient Hospital Stay (HOSPITAL_COMMUNITY): Payer: 59

## 2016-04-22 ENCOUNTER — Inpatient Hospital Stay (HOSPITAL_COMMUNITY)
Admission: AD | Admit: 2016-04-22 | Discharge: 2016-04-25 | DRG: 765 | Disposition: A | Discharge status: Home / Self Care | Payer: 59 | Source: Ambulatory Visit | Attending: Obstetrics and Gynecology | Admitting: Obstetrics and Gynecology

## 2016-04-22 ENCOUNTER — Encounter (HOSPITAL_COMMUNITY)
Admission: AD | Disposition: A | Discharge status: Home / Self Care | Payer: Self-pay | Source: Ambulatory Visit | Attending: Obstetrics and Gynecology

## 2016-04-22 DIAGNOSIS — O411290 Chorioamnionitis, unspecified trimester, not applicable or unspecified: Secondary | ICD-10-CM | POA: Diagnosis present

## 2016-04-22 DIAGNOSIS — Z3A38 38 weeks gestation of pregnancy: Secondary | ICD-10-CM | POA: Diagnosis not present

## 2016-04-22 DIAGNOSIS — O2293 Venous complication in pregnancy, unspecified, third trimester: Secondary | ICD-10-CM | POA: Diagnosis not present

## 2016-04-22 DIAGNOSIS — Z833 Family history of diabetes mellitus: Secondary | ICD-10-CM | POA: Diagnosis not present

## 2016-04-22 DIAGNOSIS — B001 Herpesviral vesicular dermatitis: Secondary | ICD-10-CM | POA: Diagnosis present

## 2016-04-22 DIAGNOSIS — O99824 Streptococcus B carrier state complicating childbirth: Secondary | ICD-10-CM | POA: Diagnosis present

## 2016-04-22 DIAGNOSIS — Z8249 Family history of ischemic heart disease and other diseases of the circulatory system: Secondary | ICD-10-CM | POA: Diagnosis not present

## 2016-04-22 DIAGNOSIS — O368390 Maternal care for abnormalities of the fetal heart rate or rhythm, unspecified trimester, not applicable or unspecified: Secondary | ICD-10-CM

## 2016-04-22 DIAGNOSIS — O9852 Other viral diseases complicating childbirth: Secondary | ICD-10-CM | POA: Diagnosis present

## 2016-04-22 DIAGNOSIS — O411230 Chorioamnionitis, third trimester, not applicable or unspecified: Secondary | ICD-10-CM | POA: Diagnosis present

## 2016-04-22 DIAGNOSIS — K219 Gastro-esophageal reflux disease without esophagitis: Secondary | ICD-10-CM | POA: Diagnosis present

## 2016-04-22 DIAGNOSIS — O9962 Diseases of the digestive system complicating childbirth: Secondary | ICD-10-CM | POA: Diagnosis present

## 2016-04-22 DIAGNOSIS — O368130 Decreased fetal movements, third trimester, not applicable or unspecified: Secondary | ICD-10-CM | POA: Diagnosis not present

## 2016-04-22 LAB — CBC WITH DIFFERENTIAL/PLATELET
Basophils Absolute: 0 10*3/uL (ref 0.0–0.1)
Basophils Relative: 0 %
Eosinophils Absolute: 0 10*3/uL (ref 0.0–0.7)
Eosinophils Relative: 0 %
HCT: 34 % — ABNORMAL LOW (ref 36.0–46.0)
Hemoglobin: 11.3 g/dL — ABNORMAL LOW (ref 12.0–15.0)
Lymphocytes Relative: 6 %
Lymphs Abs: 1.6 10*3/uL (ref 0.7–4.0)
MCH: 29.3 pg (ref 26.0–34.0)
MCHC: 33.2 g/dL (ref 30.0–36.0)
MCV: 88.1 fL (ref 78.0–100.0)
Monocytes Absolute: 1 10*3/uL (ref 0.1–1.0)
Monocytes Relative: 4 %
Neutro Abs: 23.3 10*3/uL — ABNORMAL HIGH (ref 1.7–7.7)
Neutrophils Relative %: 90 %
Other: 0 %
Platelets: 357 10*3/uL (ref 150–400)
RBC: 3.86 MIL/uL — ABNORMAL LOW (ref 3.87–5.11)
RDW: 14.9 % (ref 11.5–15.5)
WBC: 25.9 10*3/uL — ABNORMAL HIGH (ref 4.0–10.5)

## 2016-04-22 LAB — URINALYSIS, ROUTINE W REFLEX MICROSCOPIC
Bilirubin Urine: NEGATIVE
Glucose, UA: NEGATIVE mg/dL
Ketones, ur: 15 mg/dL — AB
Nitrite: NEGATIVE
Protein, ur: NEGATIVE mg/dL
Specific Gravity, Urine: 1.015 (ref 1.005–1.030)
pH: 5.5 (ref 5.0–8.0)

## 2016-04-22 LAB — URINE MICROSCOPIC-ADD ON

## 2016-04-22 LAB — TYPE AND SCREEN
ABO/RH(D): AB POS
Antibody Screen: NEGATIVE

## 2016-04-22 SURGERY — Surgical Case
Anesthesia: Spinal

## 2016-04-22 MED ORDER — NALBUPHINE HCL 10 MG/ML IJ SOLN: 5.0000 mg | INTRAMUSCULAR | Status: DC | PRN

## 2016-04-22 MED ORDER — KETOROLAC TROMETHAMINE 30 MG/ML IJ SOLN
30.0000 mg | Freq: Four times a day (QID) | INTRAMUSCULAR | Status: AC | PRN
  Administered 2016-04-23: 30 mg via INTRAVENOUS
  Filled 2016-04-22: qty 1

## 2016-04-22 MED ORDER — MORPHINE SULFATE (PF) 0.5 MG/ML IJ SOLN
INTRAMUSCULAR | Status: DC | PRN
  Administered 2016-04-22: .2 mg via INTRATHECAL

## 2016-04-22 MED ORDER — SENNOSIDES-DOCUSATE SODIUM 8.6-50 MG PO TABS
2.0000 | ORAL_TABLET | ORAL | Status: DC
Start: 2016-04-23 — End: 2016-04-25
  Administered 2016-04-23 – 2016-04-24 (×2): 2 via ORAL
  Filled 2016-04-22 (×2): qty 2

## 2016-04-22 MED ORDER — SIMETHICONE 80 MG PO CHEW
80.0000 mg | CHEWABLE_TABLET | ORAL | Status: DC
  Administered 2016-04-23 – 2016-04-24 (×2): 80 mg via ORAL
  Filled 2016-04-22 (×2): qty 1

## 2016-04-22 MED ORDER — PHENYLEPHRINE 8 MG IN D5W 100 ML (0.08MG/ML) PREMIX OPTIME
INJECTION | INTRAVENOUS | Status: AC
  Filled 2016-04-22: qty 100

## 2016-04-22 MED ORDER — LACTATED RINGERS IV SOLN
INTRAVENOUS | Status: DC | PRN
  Administered 2016-04-22 (×2): via INTRAVENOUS

## 2016-04-22 MED ORDER — SODIUM CHLORIDE 0.9% FLUSH: 3.0000 mL | INTRAVENOUS | Status: DC | PRN

## 2016-04-22 MED ORDER — PHENYLEPHRINE 8 MG IN D5W 100 ML (0.08MG/ML) PREMIX OPTIME
INJECTION | INTRAVENOUS | Status: DC | PRN
  Administered 2016-04-22: 60 ug/min via INTRAVENOUS

## 2016-04-22 MED ORDER — ONDANSETRON HCL 4 MG/2ML IJ SOLN
INTRAMUSCULAR | Status: AC
  Filled 2016-04-22: qty 2

## 2016-04-22 MED ORDER — FENTANYL CITRATE (PF) 100 MCG/2ML IJ SOLN
INTRAMUSCULAR | Status: AC
  Filled 2016-04-22: qty 2

## 2016-04-22 MED ORDER — ZOLPIDEM TARTRATE 5 MG PO TABS: 5.0000 mg | ORAL_TABLET | Freq: Every evening | ORAL | Status: DC | PRN

## 2016-04-22 MED ORDER — LACTATED RINGERS IV SOLN: INTRAVENOUS | Status: DC

## 2016-04-22 MED ORDER — OXYTOCIN 40 UNITS IN LACTATED RINGERS INFUSION - SIMPLE MED: 2.5000 [IU]/h | INTRAVENOUS | Status: AC

## 2016-04-22 MED ORDER — FENTANYL CITRATE (PF) 100 MCG/2ML IJ SOLN
INTRAMUSCULAR | Status: DC | PRN
  Administered 2016-04-22: 10 ug via INTRATHECAL

## 2016-04-22 MED ORDER — MEPERIDINE HCL 25 MG/ML IJ SOLN: 6.2500 mg | INTRAMUSCULAR | Status: DC | PRN

## 2016-04-22 MED ORDER — OXYTOCIN 10 UNIT/ML IJ SOLN
INTRAMUSCULAR | Status: DC | PRN
  Administered 2016-04-22: 40 [IU] via INTRAMUSCULAR

## 2016-04-22 MED ORDER — NALBUPHINE HCL 10 MG/ML IJ SOLN: 5.0000 mg | Freq: Once | INTRAMUSCULAR | Status: DC | PRN

## 2016-04-22 MED ORDER — BUPIVACAINE HCL (PF) 0.25 % IJ SOLN
INTRAMUSCULAR | Status: AC
  Filled 2016-04-22: qty 10

## 2016-04-22 MED ORDER — COCONUT OIL OIL: 1.0000 "application " | TOPICAL_OIL | Status: DC | PRN

## 2016-04-22 MED ORDER — DIPHENHYDRAMINE HCL 25 MG PO CAPS: 25.0000 mg | ORAL_CAPSULE | Freq: Four times a day (QID) | ORAL | Status: DC | PRN

## 2016-04-22 MED ORDER — ONDANSETRON HCL 4 MG/2ML IJ SOLN
4.0000 mg | Freq: Three times a day (TID) | INTRAMUSCULAR | Status: DC | PRN
  Administered 2016-04-23 (×2): 4 mg via INTRAVENOUS
  Filled 2016-04-22 (×2): qty 2

## 2016-04-22 MED ORDER — SODIUM CHLORIDE 0.9% FLUSH: 3.0000 mL | Freq: Two times a day (BID) | INTRAVENOUS | Status: DC

## 2016-04-22 MED ORDER — SCOPOLAMINE 1 MG/3DAYS TD PT72
MEDICATED_PATCH | TRANSDERMAL | Status: DC | PRN
  Administered 2016-04-22: 1 via TRANSDERMAL

## 2016-04-22 MED ORDER — NALOXONE HCL 0.4 MG/ML IJ SOLN: 0.4000 mg | INTRAMUSCULAR | Status: DC | PRN

## 2016-04-22 MED ORDER — SODIUM CHLORIDE 0.9 % IV SOLN
2.0000 g | Freq: Four times a day (QID) | INTRAVENOUS | Status: DC
  Administered 2016-04-22 – 2016-04-24 (×8): 2 g via INTRAVENOUS
  Filled 2016-04-22 (×8): qty 2000

## 2016-04-22 MED ORDER — FAMOTIDINE IN NACL 20-0.9 MG/50ML-% IV SOLN
20.0000 mg | Freq: Once | INTRAVENOUS | Status: AC
Start: 2016-04-22 — End: 2016-04-22
  Administered 2016-04-22: 20 mg via INTRAVENOUS
  Filled 2016-04-22: qty 50

## 2016-04-22 MED ORDER — OXYCODONE HCL 5 MG PO TABS
10.0000 mg | ORAL_TABLET | ORAL | Status: DC | PRN
  Administered 2016-04-24 – 2016-04-25 (×4): 10 mg via ORAL
  Filled 2016-04-22 (×4): qty 2

## 2016-04-22 MED ORDER — MORPHINE SULFATE (PF) 0.5 MG/ML IJ SOLN
INTRAMUSCULAR | Status: AC
  Filled 2016-04-22: qty 10

## 2016-04-22 MED ORDER — ONDANSETRON HCL 4 MG/2ML IJ SOLN
INTRAMUSCULAR | Status: DC | PRN
  Administered 2016-04-22: 4 mg via INTRAVENOUS

## 2016-04-22 MED ORDER — SIMETHICONE 80 MG PO CHEW: 80.0000 mg | CHEWABLE_TABLET | ORAL | Status: DC | PRN

## 2016-04-22 MED ORDER — DEXTROSE 5 % IV SOLN
INTRAVENOUS | Status: DC
  Administered 2016-04-22 – 2016-04-23 (×2): via INTRAVENOUS
  Filled 2016-04-22 (×2): qty 11.5

## 2016-04-22 MED ORDER — WITCH HAZEL-GLYCERIN EX PADS: 1.0000 "application " | MEDICATED_PAD | CUTANEOUS | Status: DC | PRN

## 2016-04-22 MED ORDER — PHENYLEPHRINE HCL 10 MG/ML IJ SOLN
INTRAMUSCULAR | Status: DC | PRN
  Administered 2016-04-22: 120 ug via INTRAVENOUS
  Administered 2016-04-22: 80 ug via INTRAVENOUS

## 2016-04-22 MED ORDER — LACTATED RINGERS IV BOLUS (SEPSIS)
1000.0000 mL | Freq: Once | INTRAVENOUS | Status: AC
  Administered 2016-04-22: 1000 mL via INTRAVENOUS

## 2016-04-22 MED ORDER — DIBUCAINE 1 % RE OINT: 1.0000 "application " | TOPICAL_OINTMENT | RECTAL | Status: DC | PRN

## 2016-04-22 MED ORDER — METHYLERGONOVINE MALEATE 0.2 MG/ML IJ SOLN: 0.2000 mg | INTRAMUSCULAR | Status: DC | PRN

## 2016-04-22 MED ORDER — METHYLERGONOVINE MALEATE 0.2 MG PO TABS: 0.2000 mg | ORAL_TABLET | ORAL | Status: DC | PRN

## 2016-04-22 MED ORDER — CLINDAMYCIN PHOSPHATE 900 MG/50ML IV SOLN
900.0000 mg | INTRAVENOUS | Status: DC
  Administered 2016-04-23 – 2016-04-24 (×4): 900 mg via INTRAVENOUS
  Filled 2016-04-22 (×4): qty 50

## 2016-04-22 MED ORDER — SODIUM CHLORIDE 0.9 % IR SOLN
Status: DC | PRN
  Administered 2016-04-22: 1000 mL

## 2016-04-22 MED ORDER — BISACODYL 10 MG RE SUPP: 10.0000 mg | Freq: Every day | RECTAL | Status: DC | PRN

## 2016-04-22 MED ORDER — SOD CITRATE-CITRIC ACID 500-334 MG/5ML PO SOLN
30.0000 mL | Freq: Once | ORAL | Status: AC
  Administered 2016-04-22: 30 mL via ORAL
  Filled 2016-04-22: qty 15

## 2016-04-22 MED ORDER — PRENATAL MULTIVITAMIN CH
1.0000 | ORAL_TABLET | Freq: Every day | ORAL | Status: DC
  Administered 2016-04-23 – 2016-04-25 (×3): 1 via ORAL
  Filled 2016-04-22 (×3): qty 1

## 2016-04-22 MED ORDER — MENTHOL 3 MG MT LOZG: 1.0000 | LOZENGE | OROMUCOSAL | Status: DC | PRN

## 2016-04-22 MED ORDER — FERROUS SULFATE 325 (65 FE) MG PO TABS
325.0000 mg | ORAL_TABLET | Freq: Two times a day (BID) | ORAL | Status: DC
  Administered 2016-04-23 – 2016-04-25 (×5): 325 mg via ORAL
  Filled 2016-04-22 (×5): qty 1

## 2016-04-22 MED ORDER — LACTATED RINGERS IV SOLN
INTRAVENOUS | Status: DC | PRN
  Administered 2016-04-22: 19:00:00 via INTRAVENOUS

## 2016-04-22 MED ORDER — DIPHENHYDRAMINE HCL 25 MG PO CAPS: 25.0000 mg | ORAL_CAPSULE | ORAL | Status: DC | PRN

## 2016-04-22 MED ORDER — NALBUPHINE HCL 10 MG/ML IJ SOLN
5.0000 mg | Freq: Once | INTRAMUSCULAR | Status: DC | PRN
Start: 2016-04-22 — End: 2016-04-24

## 2016-04-22 MED ORDER — DIPHENHYDRAMINE HCL 50 MG/ML IJ SOLN: 12.5000 mg | INTRAMUSCULAR | Status: DC | PRN

## 2016-04-22 MED ORDER — FENTANYL CITRATE (PF) 100 MCG/2ML IJ SOLN
25.0000 ug | INTRAMUSCULAR | Status: DC | PRN
  Administered 2016-04-22 (×2): 25 ug via INTRAVENOUS

## 2016-04-22 MED ORDER — SODIUM CHLORIDE 0.9 % IV SOLN
8.0000 mg | Freq: Once | INTRAVENOUS | Status: AC
  Administered 2016-04-22: 8 mg via INTRAVENOUS
  Filled 2016-04-22: qty 4

## 2016-04-22 MED ORDER — SCOPOLAMINE 1 MG/3DAYS TD PT72
MEDICATED_PATCH | TRANSDERMAL | Status: AC
  Filled 2016-04-22: qty 1

## 2016-04-22 MED ORDER — NALBUPHINE HCL 10 MG/ML IJ SOLN
5.0000 mg | INTRAMUSCULAR | Status: DC | PRN
Start: 2016-04-22 — End: 2016-04-24

## 2016-04-22 MED ORDER — KETOROLAC TROMETHAMINE 30 MG/ML IJ SOLN: 30.0000 mg | Freq: Four times a day (QID) | INTRAMUSCULAR | Status: AC | PRN

## 2016-04-22 MED ORDER — FLEET ENEMA 7-19 GM/118ML RE ENEM: 1.0000 | ENEMA | Freq: Every day | RECTAL | Status: DC | PRN

## 2016-04-22 MED ORDER — OXYTOCIN 10 UNIT/ML IJ SOLN
INTRAMUSCULAR | Status: AC
  Filled 2016-04-22: qty 4

## 2016-04-22 MED ORDER — LACTATED RINGERS IV SOLN
INTRAVENOUS | Status: DC
  Administered 2016-04-23 – 2016-04-24 (×3): via INTRAVENOUS

## 2016-04-22 MED ORDER — SCOPOLAMINE 1 MG/3DAYS TD PT72: 1.0000 | MEDICATED_PATCH | Freq: Once | TRANSDERMAL | Status: DC

## 2016-04-22 MED ORDER — IBUPROFEN 600 MG PO TABS
600.0000 mg | ORAL_TABLET | Freq: Four times a day (QID) | ORAL | Status: DC
Start: 2016-04-22 — End: 2016-04-25
  Administered 2016-04-23 – 2016-04-25 (×10): 600 mg via ORAL
  Filled 2016-04-22 (×10): qty 1

## 2016-04-22 MED ORDER — SIMETHICONE 80 MG PO CHEW
80.0000 mg | CHEWABLE_TABLET | Freq: Three times a day (TID) | ORAL | Status: DC
  Administered 2016-04-23 – 2016-04-25 (×7): 80 mg via ORAL
  Filled 2016-04-22 (×8): qty 1

## 2016-04-22 MED ORDER — NALOXONE HCL 2 MG/2ML IJ SOSY: 1.0000 ug/kg/h | PREFILLED_SYRINGE | INTRAVENOUS | Status: DC | PRN

## 2016-04-22 MED ORDER — OXYCODONE HCL 5 MG PO TABS
5.0000 mg | ORAL_TABLET | ORAL | Status: DC | PRN
  Administered 2016-04-23 – 2016-04-24 (×2): 5 mg via ORAL
  Filled 2016-04-22 (×2): qty 1

## 2016-04-22 MED ORDER — ACETAMINOPHEN 325 MG PO TABS: 650.0000 mg | ORAL_TABLET | ORAL | Status: DC | PRN

## 2016-04-22 MED ORDER — SODIUM CHLORIDE 0.9 % IV SOLN: 250.0000 mL | INTRAVENOUS | Status: DC

## 2016-04-22 MED ORDER — BUPIVACAINE HCL (PF) 0.25 % IJ SOLN
INTRAMUSCULAR | Status: DC | PRN
  Administered 2016-04-22: 10 mL

## 2016-04-22 SURGICAL SUPPLY — 42 items
BARRIER ADHS 3X4 INTERCEED (GAUZE/BANDAGES/DRESSINGS) ×2 IMPLANT
BENZOIN TINCTURE PRP APPL 2/3 (GAUZE/BANDAGES/DRESSINGS) IMPLANT
CLAMP CORD UMBIL (MISCELLANEOUS) IMPLANT
CLOSURE STERI STRIP 1/2 X4 (GAUZE/BANDAGES/DRESSINGS) ×2 IMPLANT
CLOTH BEACON ORANGE TIMEOUT ST (SAFETY) ×2 IMPLANT
CONTAINER PREFILL 10% NBF 15ML (MISCELLANEOUS) IMPLANT
DRAPE C SECTION CLR SCREEN (DRAPES) ×2 IMPLANT
DRSG OPSITE POSTOP 4X10 (GAUZE/BANDAGES/DRESSINGS) ×2 IMPLANT
DURAPREP 26ML APPLICATOR (WOUND CARE) ×2 IMPLANT
ELECT REM PT RETURN 9FT ADLT (ELECTROSURGICAL) ×2
ELECTRODE REM PT RTRN 9FT ADLT (ELECTROSURGICAL) ×1 IMPLANT
EXTRACTOR VACUUM M CUP 4 TUBE (SUCTIONS) IMPLANT
GLOVE BIOGEL PI IND STRL 7.0 (GLOVE) ×2 IMPLANT
GLOVE BIOGEL PI INDICATOR 7.0 (GLOVE) ×2
GLOVE ECLIPSE 6.5 STRL STRAW (GLOVE) ×2 IMPLANT
GOWN STRL REUS W/TWL LRG LVL3 (GOWN DISPOSABLE) ×4 IMPLANT
KIT ABG SYR 3ML LUER SLIP (SYRINGE) IMPLANT
NEEDLE HYPO 22GX1.5 SAFETY (NEEDLE) ×2 IMPLANT
NEEDLE HYPO 25X5/8 SAFETYGLIDE (NEEDLE) IMPLANT
NS IRRIG 1000ML POUR BTL (IV SOLUTION) ×2 IMPLANT
PACK C SECTION WH (CUSTOM PROCEDURE TRAY) ×2 IMPLANT
PAD OB MATERNITY 4.3X12.25 (PERSONAL CARE ITEMS) ×2 IMPLANT
RTRCTR C-SECT PINK 25CM LRG (MISCELLANEOUS) ×2 IMPLANT
STRIP CLOSURE SKIN 1/2X4 (GAUZE/BANDAGES/DRESSINGS) IMPLANT
SUT CHROMIC GUT AB #0 18 (SUTURE) IMPLANT
SUT MNCRL 0 VIOLET CTX 36 (SUTURE) ×3 IMPLANT
SUT MON AB 2-0 SH 27 (SUTURE)
SUT MON AB 2-0 SH27 (SUTURE) IMPLANT
SUT MON AB 3-0 SH 27 (SUTURE)
SUT MON AB 3-0 SH27 (SUTURE) IMPLANT
SUT MON AB 4-0 PS1 27 (SUTURE) ×2 IMPLANT
SUT MONOCRYL 0 CTX 36 (SUTURE) ×3
SUT PLAIN 2 0 (SUTURE) ×1
SUT PLAIN 2 0 XLH (SUTURE) IMPLANT
SUT PLAIN ABS 2-0 CT1 27XMFL (SUTURE) ×1 IMPLANT
SUT VIC AB 0 CT1 36 (SUTURE) ×4 IMPLANT
SUT VIC AB 2-0 CT1 27 (SUTURE) ×1
SUT VIC AB 2-0 CT1 TAPERPNT 27 (SUTURE) ×1 IMPLANT
SUT VIC AB 4-0 PS2 27 (SUTURE) IMPLANT
SYR CONTROL 10ML LL (SYRINGE) ×2 IMPLANT
TOWEL OR 17X24 6PK STRL BLUE (TOWEL DISPOSABLE) ×2 IMPLANT
TRAY FOLEY CATH SILVER 14FR (SET/KITS/TRAYS/PACK) IMPLANT

## 2016-04-22 NOTE — Anesthesia Postprocedure Evaluation (Signed)
Anesthesia Post Note  Patient: Consepcion Dory  Procedure(s) Performed: Procedure(s) (LRB): CESAREAN SECTION (N/A)  Patient location during evaluation: PACU Anesthesia Type: Epidural Level of consciousness: awake and alert and patient cooperative Pain management: pain level controlled Vital Signs Assessment: post-procedure vital signs reviewed and stable Respiratory status: spontaneous breathing Cardiovascular status: blood pressure returned to baseline Postop Assessment: spinal receding Anesthetic complications: no     Last Vitals:  Filed Vitals:   04/22/16 2030 04/22/16 2110  BP: 104/64 103/58  Pulse: 95 101  Temp: 37.1 C 37.3 C  Resp: 19 20    Last Pain:  Filed Vitals:   04/22/16 2119  PainSc: 1    Pain Goal: Patients Stated Pain Goal: 0 (04/22/16 1445)               Jahir Halt A

## 2016-04-22 NOTE — Op Note (Signed)
Renee [name], Renee [name]                 ACCOUNT NO.:  [id number]  MEDICAL RECORD NO.:  [id number]  LOCATION:  9127                          FACILITY:  [facility]  PHYSICIAN:  Servando Salina, M.D.DATE OF BIRTH:  14-Apr-1971  DATE OF PROCEDURE:  04/22/2016 DATE OF DISCHARGE:                              OPERATIVE REPORT   PREOPERATIVE DIAGNOSIS:  Nonreassuring fetal tracing, intrauterine gestation at 25 and 2/7th weeks.  PROCEDURE:  Primary cesarean section, Kerr hysterotomy.  POSTOPERATIVE DIAGNOSIS:  Nonreassuring fetal testing, intrauterine gestation at 92 and 2/7th weeks.  Maternal fever, presumed chorioamnionitis.  ANESTHESIA:  Spinal.  SURGEON:  Servando Salina, M.D.  ASSISTANT:  Hester Mates, CNM.  DESCRIPTION OF PROCEDURE:  Under adequate spinal anesthesia, the patient was placed in the supine position with a left lateral tilt.  She was sterilely prepped and draped in usual fashion.  An indwelling Foley catheter was sterilely placed.  A 0.25% Marcaine was injected along the planned Pfannenstiel skin incision site.  Pfannenstiel skin incision was then made, carried down to the rectus fascia.  The rectus fascia was opened transversely.  Rectus fascia was then bluntly and sharply dissected off the rectus muscle in a superior to inferior fashion.  The rectus muscles were separated in the midline.  The parietal peritoneum was entered sharply and extended.  A self-retaining Alexis retractor was then placed.  The vesicouterine peritoneum was opened transversely.  The bladder was displaced inferiorly with blunt dissection.  A curvilinear low transverse uterine incision was then made and extended with bandage scissors.  Clear copious fluid was noted.  Subsequent delivery of a live female was accomplished.  Cord was delayed clamping after a minute.  Baby was bulb suctioned in the abdomen.  The baby was subsequently transferred to the awaiting pediatricians with Apgars 8 and 9 at 1  and 5 minutes.  Cord pH was obtained.  The placenta was spontaneously intact with mild malodorous smell, for which it was sent to Pathology.  Uterine cavity was cleaned of debris.  Uterine incision had no extension.  It was closed in two layers, the first layer with 0 Monocryl running lock stitch.  Second layer was imbricated using 0 Monocryl suture.  Normal tubes and ovaries were noted bilaterally.  The abdomen was copiously irrigated and suctioned of debris.  The self-retaining Alexis retractor was removed.  Interceed was placed in inverted T fashion in the lower uterine segment overlying the incision.  The parietal peritoneum was then closed with 2-0 Vicryl.  The rectus fascia was closed with 0 Vicryl x2.  The subcutaneous area was irrigated.  Small bleeders were cauterized.  Interrupted 2-0 plain sutures were placed, and the skin approximated with 4-0 Vicryl subcuticular closure.  SPECIMENS:  Placenta was sent to Pathology.  ESTIMATED BLOOD LOSS:  1 L.  URINE OUTPUT:  200 mL.  INTRAOPERATIVE FLUIDS:  2200 mL.  COUNTS:  Sponge and instrument counts x2 was correct.  COMPLICATION:  None.  DISPOSITION:  The patient tolerated the procedure well, was transferred to recovery room in stable condition.     Servando Salina, M.D.     [place]/MEDQ  D:  04/22/2016  T:  04/22/2016  Job:  [id number] 

## 2016-04-22 NOTE — Transfer of Care (Signed)
Immediate Anesthesia Transfer of Care Note  Patient: Renee [name]  Procedure(s) Performed: Procedure(s): CESAREAN SECTION (N/A)  Patient Location: PACU  Anesthesia Type:Spinal  Level of Consciousness: awake, alert  and oriented  Airway & Oxygen Therapy: Patient Spontanous Breathing  Post-op Assessment: Report given to RN and Post -op Vital signs reviewed and stable  Post vital signs: Reviewed and stable  Last Vitals:  Filed Vitals:   04/22/16 1508 04/22/16 1726  BP:    Pulse:    Temp: 37.2 C 38.3 C  Resp:      Last Pain:  Filed Vitals:   04/22/16 1859  PainSc: 9       Patients Stated Pain Goal: 0 ([id number])  Complications: No apparent anesthesia complications

## 2016-04-22 NOTE — H&P (Addendum)
Renee [name] is a 45 y.o. female  presenting @ 38 2/7 weeks  C/S due to MAU evaluation( see note) that revealed BPP 2/8, maternal temp 100.9 axillary, fetal and maternal tachycardia. Pt denies ROM. (+) DFM. (+) GBS. Hx HSV on Valtrex. No recent outbreak.  Membranes stripped yesterday. Wbc 25.9K  Maternal Medical History:  Reason for admission: Nausea.   Contractions: Frequency: irregular.    Fetal activity: Perceived fetal activity is decreased.    Prenatal complications: no prenatal complications   OB History    Gravida Para Term Preterm AB TAB SAB Ectopic Multiple Living   4 2 2  0 1 0 1 0 0 2     Past Medical History  Diagnosis Date  . HSV infection     Has never had an outbreak on valtrex, fever blister  . Unspecified iridocyclitis     right eye  . Preglaucoma, unspecified     elevated IOP/  right eye  . GERD (gastroesophageal reflux disease)     Tums prn, worse in pregnancy  . Miscarriage   . Uveitis    Past Surgical History  Procedure Laterality Date  . Cataract extraction w/ intraocular lens implant Right APRIL 2014  . Anal fissure repair  AGE 72  . Dilation and evacuation N/A 08/21/2013    miscarriage   Family History: family history includes Cancer in her father; Diabetes in her father and mother; Hyperlipidemia in her father; Hypertension in her father and mother. Social History:  reports that she has never smoked. She has never used smokeless tobacco. She reports that she does not drink alcohol or use illicit drugs.   Prenatal Transfer Tool  Maternal Diabetes: No Genetic Screening: Normal Maternal Ultrasounds/Referrals: Normal Fetal Ultrasounds or other Referrals:  None Maternal Substance Abuse:  No Significant Maternal Medications:  Meds include: Other: valtrex Significant Maternal Lab Results:  Lab values include: Group B Strep positive Other Comments:  fetal tachycardia, maternal fever with associated tachycardia  Review of Systems  Constitutional:  Positive for fever and chills.  Gastrointestinal: Positive for nausea.  Genitourinary: Positive for frequency.      Blood pressure 110/54, pulse 114, temperature 99 F (37.2 C), resp. rate 18, height 5' 2.5" (1.588 m), weight 92.588 kg (204 lb 1.9 oz), unknown if currently breastfeeding. Exam Physical Exam  Constitutional: She is oriented to person, place, and time. She appears well-developed and well-nourished.  HENT:  Head: Atraumatic.  Eyes: EOM are normal.  Neck: Neck supple.  Cardiovascular: Regular rhythm.   GI: Soft.  Musculoskeletal: She exhibits edema.  Neurological: She is alert and oriented to person, place, and time.  Skin: Skin is warm and dry.  Psychiatric: She has a normal mood and affect.    Prenatal labs: ABO, Rh:  AB positive Antibody:  neg Rubella:  Immune RPR:   NR HBsAg:   neg HIV:   NR GBS:   positive  Assessment/Plan: Nonreassuring fetal testing( BPP 2/10) Maternal fever with assoc fetal and maternal tachycardia, poss due to presumed Chorioamnionitis , poss urosepsis( urinary freq) GBS cx (+)  IUP @ 38 2/7 weeks P) Proceed with primary C/S. Risk of surgery reviewed with pt and husband:  Including infection( already present), bleeding which may require blood  Transfusion and its risk, internal scar tissue, injury to surrounding organ structures. ALl ? Answered. Consent signed. Blood cultures done. Amp/Gent/clindamycin IV started Prior to [place] A 04/22/2016, 4:29 PM

## 2016-04-22 NOTE — MAU Note (Signed)
Pt reports having chills and nausea and ctx. Was at office today and was 2 cm. Told to come in and be evaluated.

## 2016-04-22 NOTE — MAU Provider Note (Addendum)
History     Chief Complaint  Patient presents with  . Chills  . Nausea   44 yo G4 P2012 MWF @ 38 2/[redacted] weeks gestation sent from office for c/o chills, nausea and not feeling well since 9 am today. (+) irreg ctx. Denies diarrhea, vomiting or vaginal bleeding. (+) decreased fetal movement since arrival today. Pt is known GBS cx (+) and per husband had her membranes stripped by Dr Dellis Filbert yesterday. No other family member ill. Pt was seen in office today by Dr Ronita Hipps and had VE 2-3 cm.  EFW 9lb 4 oz no GDM c/o urinary frequency denies back pain or dysuria  OB History    Gravida Para Term Preterm AB TAB SAB Ectopic Multiple Living   4 2 2  0 1 0 1 0 0 2      Past Medical History  Diagnosis Date  . HSV infection     Has never had an outbreak on valtrex, fever blister  . Unspecified iridocyclitis     right eye  . Preglaucoma, unspecified     elevated IOP/  right eye  . GERD (gastroesophageal reflux disease)     Tums prn, worse in pregnancy  . Miscarriage   . Uveitis     Past Surgical History  Procedure Laterality Date  . Cataract extraction w/ intraocular lens implant Right APRIL 2014  . Anal fissure repair  AGE 45  . Dilation and evacuation N/A 08/21/2013    miscarriage    Family History  Problem Relation Age of Onset  . Diabetes Mother   . Hypertension Mother   . Diabetes Father   . Hypertension Father   . Cancer Father     Colon 48, lung- former smoker 54  . Hyperlipidemia Father     mother    Social History  Substance Use Topics  . Smoking status: Never Smoker   . Smokeless tobacco: Never Used  . Alcohol Use: No    Allergies: No Known Allergies  Prescriptions prior to admission  Medication Sig Dispense Refill Last Dose  . calcium carbonate (TUMS - DOSED IN MG ELEMENTAL CALCIUM) 500 MG chewable tablet Chew 3 tablets by mouth 2 (two) times daily as needed for indigestion or heartburn.   04/21/2016 at Unknown time  . Prenatal Vit-Fe Fumarate-FA (PRENATAL  MULTIVITAMIN) TABS tablet Take 1 tablet by mouth daily at 12 noon.   04/22/2016 at Unknown time  . valACYclovir (VALTREX) 1000 MG tablet Take 1,000 mg by mouth daily.   04/22/2016 at Unknown time     Physical Exam   Blood pressure 110/54, pulse 114, temperature 99 F (37.2 C), resp. rate 18, height 5' 2.5" (1.588 m), weight 92.588 kg (204 lb 1.9 oz), unknown if currently breastfeeding.  General appearance: alert, cooperative and appears stated age Lungs: clear to auscultation bilaterally Heart: tachycardia Abdomen: gravid nontender Extremities: edema 1+ Pulses: 2+ and symmetric Skin: Skin color, texture, turgor normal. No rashes or lesions   CBC    Component Value Date/Time   WBC 25.9* 04/22/2016 1520   RBC 3.86* 04/22/2016 1520   HGB 11.3* 04/22/2016 1520   HCT 34.0* 04/22/2016 1520   PLT 357 04/22/2016 1520   MCV 88.1 04/22/2016 1520   MCH 29.3 04/22/2016 1520   MCHC 33.2 04/22/2016 1520   RDW 14.9 04/22/2016 1520   LYMPHSABS 1.6 04/22/2016 1520   MONOABS 1.0 04/22/2016 1520   EOSABS 0.0 04/22/2016 1520   BASOSABS 0.0 04/22/2016 1520    Urinalysis  Component Value Date/Time   COLORURINE YELLOW 04/22/2016 1420   APPEARANCEUR CLEAR 04/22/2016 1420   LABSPEC 1.015 04/22/2016 1420   PHURINE 5.5 04/22/2016 1420   GLUCOSEU NEGATIVE 04/22/2016 1420   HGBUR TRACE* 04/22/2016 1420   BILIRUBINUR NEGATIVE 04/22/2016 1420   KETONESUR 15* 04/22/2016 1420   PROTEINUR NEGATIVE 04/22/2016 1420   UROBILINOGEN 0.2 10/04/2011 0735   NITRITE NEGATIVE 04/22/2016 1420   LEUKOCYTESUR TRACE* 04/22/2016 1420   Tracing: baseline 170's -180 min variability occ ctx  IMP; Fetal tachycardia with associated maternal tachycardia, elevated wbc . Potential etiology  Urosepsis, presumed chorio, IUP @ 38 2/7 weeks GBS cx (+)  P) admit . Start IV PCN. ( may need broader spectrum coverage). BPP to determine mode of delivery( C/S if poor BPP...proceed with C/S) vs active induction of labor with  pitocin/amniotomy. Analgesic prn. Pt and husband advised of plan.  zofran for Nausea  ED Course   MDM   Saron Tweed A, MD 4:14 PM 04/22/2016     Addendum: BPP 2/8. Mat temp 100.9 axillary. Will proceed with delivery

## 2016-04-22 NOTE — Anesthesia Procedure Notes (Addendum)
Spinal Patient location during procedure: OR Start time: 04/22/2016 6:20 PM Staffing Anesthesiologist: Symone Cornman Preanesthetic Checklist Completed: patient identified, site marked, surgical consent, pre-op evaluation, timeout performed, IV checked, risks and benefits discussed and monitors and equipment checked Spinal Block Patient position: sitting Prep: DuraPrep Patient monitoring: heart rate, cardiac monitor, continuous pulse ox and blood pressure Approach: left paramedian Location: L3-4 Injection technique: single-shot Needle Needle type: Spinocan  Needle gauge: 24 G Needle length: 9 cm Needle insertion depth: 5 cm Assessment Sensory level: T6 Additional Notes Patient identified. Risks/Benefits/Options discussed with patient including but not limited to bleeding, infection, nerve damage, paralysis, failed block, incomplete pain control, headache, blood pressure changes, nausea, vomiting, reactions to medication both or allergic, itching and postpartum back pain. Confirmed with bedside nurse the patient's most recent platelet count. Confirmed with patient that they are not currently taking any anticoagulation, have any bleeding history or any family history of bleeding disorders. Patient expressed understanding and wished to proceed. All questions were answered. Sterile technique was used throughout the entire procedure. Please see nursing notes for vital signs. Test dose was given through epidural catheter and negative prior to continuing to dose epidural or start infusion. Warning signs of high block given to the patient including shortness of breath, tingling/numbness in hands, complete motor block, or any concerning symptoms with instructions to call for help. Patient was given instructions on fall risk and not to get out of bed. All questions and concerns addressed with instructions to call with any issues or inadequate analgesia.

## 2016-04-22 NOTE — Anesthesia Preprocedure Evaluation (Addendum)
Anesthesia Evaluation  Patient identified by MRN, date of birth, ID band Patient awake    Reviewed: Allergy & Precautions, NPO status , Patient's Chart, lab work & pertinent test results  Airway Mallampati: II  TM Distance: >3 FB Neck ROM: Full    Dental no notable dental hx.    Pulmonary neg pulmonary ROS,    Pulmonary exam normal breath sounds clear to auscultation       Cardiovascular negative cardio ROS Normal cardiovascular exam Rhythm:Regular Rate:Normal     Neuro/Psych negative neurological ROS  negative psych ROS   GI/Hepatic negative GI ROS, Neg liver ROS,   Endo/Other  negative endocrine ROS  Renal/GU negative Renal ROS  negative genitourinary   Musculoskeletal negative musculoskeletal ROS (+)   Abdominal   Peds negative pediatric ROS (+)  Hematology negative hematology ROS (+)   Anesthesia Other Findings   Reproductive/Obstetrics (+) Pregnancy                             Anesthesia Physical Anesthesia Plan  ASA: II  Anesthesia Plan: Spinal   Post-op Pain Management:    Induction:   Airway Management Planned: Simple Face Mask  Additional Equipment:   Intra-op Plan:   Post-operative Plan:   Informed Consent: I have reviewed the patients History and Physical, chart, labs and discussed the procedure including the risks, benefits and alternatives for the proposed anesthesia with the patient or authorized representative who has indicated his/her understanding and acceptance.   Dental advisory given  Plan Discussed with: CRNA, Anesthesiologist and Surgeon  Anesthesia Plan Comments:        Anesthesia Quick Evaluation

## 2016-04-22 NOTE — Brief Op Note (Signed)
04/22/2016  7:45 PM  PATIENT:  Renee [name]  45 y.o. female  PRE-OPERATIVE DIAGNOSIS:   non-reassuring fetal testing, IUP @ 38 2/[redacted] weeks gestation, Maternal fever ? Presumed chorioamnionitis  POST-OPERATIVE DIAGNOSIS:  non-reassuring fetal testing, presumed chorioamnionitis, IUP @ 38 2/7 weeks  PROCEDURE:  Primary cesarean section, kerr hysterotomy  SURGEON:  Surgeon(s) and Role:    * Servando Salina, MD - Primary  PHYSICIAN ASSISTANT:   ASSISTANTS: Laury Deep, CNM   ANESTHESIA:   spinal Findings: live female floating vtx, nl tubes and ovaries, malodorous placenta to path, EBL:  Total I/O In: 700 [I.V.:700] Out: -   BLOOD ADMINISTERED:none  DRAINS: none   LOCAL MEDICATIONS USED:  MARCAINE     SPECIMEN:  Source of Specimen:  Placenta  DISPOSITION OF SPECIMEN:  PATHOLOGY  COUNTS:  YES  TOURNIQUET:  * No tourniquets in log *  DICTATION: .Other Dictation: Dictation Number [id number]  PLAN OF CARE: Admit to inpatient   PATIENT DISPOSITION:  PACU - hemodynamically stable.   Delay start of Pharmacological VTE agent (>24hrs) due to surgical blood loss or risk of bleeding: no

## 2016-04-22 NOTE — Consult Note (Signed)
Neonatology Note:   Attendance at C-section:    I was asked by Dr. Garwin Brothers to attend this primary C/S at 38 2/7 weeks due to maternal fever and BPP 2/8. The mother is a G4P2A1 AB pos, GBS pos presenting at the office with fever to 100.9 degrees, chills, nausea. She has a history of HSV, and is on Valtrex suppression, without lesions. She got 1 dose each of Ampicillin and Gentamicin 1 hour before delivery. ROM at delivery, fluid clear. Infant vigorous with good spontaneous cry and tone. Delayed cord clamping was done. Needed no suctioning. Ap 8/9. Lungs clear to ausc in DR. To CN to care of Pediatrician. [facility] early onset sepsis calculator was utilized to assess risk of sepsis: no labs or antibiotics recommended for this well-appearing infant.   Real Cons, MD

## 2016-04-23 ENCOUNTER — Encounter (HOSPITAL_COMMUNITY): Payer: Self-pay | Admitting: Obstetrics and Gynecology

## 2016-04-23 LAB — CBC WITH DIFFERENTIAL/PLATELET
Basophils Absolute: 0 10*3/uL (ref 0.0–0.1)
Basophils Relative: 0 %
Eosinophils Absolute: 0.1 10*3/uL (ref 0.0–0.7)
Eosinophils Relative: 1 %
HCT: 29.2 % — ABNORMAL LOW (ref 36.0–46.0)
Hemoglobin: 9.7 g/dL — ABNORMAL LOW (ref 12.0–15.0)
Lymphocytes Relative: 14 %
Lymphs Abs: 1.1 10*3/uL (ref 0.7–4.0)
MCH: 28.9 pg (ref 26.0–34.0)
MCHC: 33.2 g/dL (ref 30.0–36.0)
MCV: 86.9 fL (ref 78.0–100.0)
Monocytes Absolute: 0.2 10*3/uL (ref 0.1–1.0)
Monocytes Relative: 3 %
Neutro Abs: 6.7 10*3/uL (ref 1.7–7.7)
Neutrophils Relative %: 82 %
Platelets: 308 10*3/uL (ref 150–400)
RBC: 3.36 MIL/uL — ABNORMAL LOW (ref 3.87–5.11)
RDW: 15.1 % (ref 11.5–15.5)
WBC: 8.1 10*3/uL (ref 4.0–10.5)

## 2016-04-23 LAB — RPR: RPR Ser Ql: NONREACTIVE

## 2016-04-23 LAB — COMPREHENSIVE METABOLIC PANEL
ALT: 13 U/L — ABNORMAL LOW (ref 14–54)
AST: 23 U/L (ref 15–41)
Albumin: 2.3 g/dL — ABNORMAL LOW (ref 3.5–5.0)
Alkaline Phosphatase: 56 U/L (ref 38–126)
Anion gap: 7 (ref 5–15)
BUN: 8 mg/dL (ref 6–20)
CO2: 22 mmol/L (ref 22–32)
Calcium: 8.4 mg/dL — ABNORMAL LOW (ref 8.9–10.3)
Chloride: 107 mmol/L (ref 101–111)
Creatinine, Ser: 0.63 mg/dL (ref 0.44–1.00)
GFR calc Af Amer: >60 mL/min (ref >60)
GFR calc non Af Amer: >60 mL/min (ref >60)
Glucose, Bld: 129 mg/dL — ABNORMAL HIGH (ref 65–99)
Potassium: 3.4 mmol/L — ABNORMAL LOW (ref 3.5–5.1)
Sodium: 136 mmol/L (ref 135–145)
Total Bilirubin: 0.5 mg/dL (ref 0.3–1.2)
Total Protein: 5.3 g/dL — ABNORMAL LOW (ref 6.5–8.1)

## 2016-04-23 LAB — CBC
HCT: 30.3 % — ABNORMAL LOW (ref 36.0–46.0)
Hemoglobin: 9.8 g/dL — ABNORMAL LOW (ref 12.0–15.0)
MCH: 28.1 pg (ref 26.0–34.0)
MCHC: 32.3 g/dL (ref 30.0–36.0)
MCV: 86.8 fL (ref 78.0–100.0)
Platelets: 314 10*3/uL (ref 150–400)
RBC: 3.49 MIL/uL — ABNORMAL LOW (ref 3.87–5.11)
RDW: 15.1 % (ref 11.5–15.5)
WBC: 12.2 10*3/uL — ABNORMAL HIGH (ref 4.0–10.5)

## 2016-04-23 MED ORDER — LACTATED RINGERS IV BOLUS (SEPSIS)
500.0000 mL | Freq: Once | INTRAVENOUS | Status: AC
  Administered 2016-04-23: 500 mL via INTRAVENOUS

## 2016-04-23 MED ORDER — ACETAMINOPHEN 500 MG PO TABS
1000.0000 mg | ORAL_TABLET | Freq: Once | ORAL | Status: AC
  Administered 2016-04-23: 1000 mg via ORAL
  Filled 2016-04-23: qty 2

## 2016-04-23 MED ORDER — FAMOTIDINE IN NACL 20-0.9 MG/50ML-% IV SOLN
20.0000 mg | Freq: Once | INTRAVENOUS | Status: AC
  Administered 2016-04-23: 20 mg via INTRAVENOUS
  Filled 2016-04-23: qty 50

## 2016-04-23 NOTE — Anesthesia Postprocedure Evaluation (Signed)
Anesthesia Post Note  Patient: Renee [name]  Procedure(s) Performed: Procedure(s) (LRB): CESAREAN SECTION (N/A)  Patient location during evaluation: Mother Baby Anesthesia Type: Spinal Level of consciousness: oriented and awake and alert Pain management: pain level controlled Vital Signs Assessment: post-procedure vital signs reviewed and stable Respiratory status: spontaneous breathing and respiratory function stable Cardiovascular status: blood pressure returned to baseline and stable Postop Assessment: no headache, no backache and patient able to bend at knees Anesthetic complications: no     Last Vitals:  Filed Vitals:   04/23/16 0400 04/23/16 0600  BP:  92/49  Pulse:  79  Temp: 36.8 C 36.7 C  Resp:  18    Last Pain:  Filed Vitals:   04/23/16 0642  PainSc: 5    Pain Goal: Patients Stated Pain Goal: 2 (04/23/16 0600)               Rayvon Char

## 2016-04-23 NOTE — Progress Notes (Signed)
Labs reviewed stable  Will leave foley in tonight / SCD thru the night Attempt ambulation again in AM  No evidence of hypovolemia  Artelia Laroche CNM [facility] 04/23/2016 @ 2140pm

## 2016-04-23 NOTE — Progress Notes (Signed)
MD contacted to review patient's vital signs and continued difficulty with ambulation. MD in surgery during call, will follow-up when complete.

## 2016-04-23 NOTE — Progress Notes (Signed)
RTC from OR to come see patient Here to see patient  S:  Reports persistent dizziness and nausea             Bleeding is light             Pain controlled withoxycodone and motrin             Not out of bed yet            Ate  O:  Afebrile since 0200 (tmax 100.8 @ 0210am)             VS: BP 84/41 mmHg  Pulse 50  Temp(Src) 97.4 F (36.3 C) (Oral)  Resp 18  Ht 5' 2.5" (1.588 m)  Wt 92.588 kg (204 lb 1.9 oz)  BMI 36.72 kg/m2  SpO2 94%  Breastfeeding? Unknown              Orthostatics completed: BP 83/46 -95/51 -84/41 / pulse 60-80-50   LABS:              Recent Labs  04/22/16 1520 04/23/16 0615  WBC 25.9* 12.2*  HGB 11.3* 9.8*  PLT 357 314           I&O:  outpt 1700 out today              Physical Exam:              A:         Persistent nausea and dizziness  Orthostatics normal  (baseline BP 100-120/60)  P: Repeat CBC with diff (last labs 0600) and CMP  [name], Renee CNM, MSN, FACNM 04/23/2016, 3:33 PM    Labs reviewed - WBC down / creatnine 0.6 / HGB & HCT no change

## 2016-04-23 NOTE — Lactation Note (Signed)
This note was copied from a baby's chart. Lactation Consultation Note Experienced BF mom BF her now 43 yr old for 6 months. BF her now 22 months for 1 yr. Mom has pendulum breast w/everted nipples. Mom asked for shells. Stated that they help her nipple from being painful. Mom isn't feeling well. Has chorio, on triple antibiotics..mom states she feels terrible. FOB at bedside and caring for baby. Highlights basic BF and care. Mom encouraged to feed baby 8-12 times/24 hours and with feeding cues. [place] brochure given w/resources, support groups and [place] services.  Patient Name: Renee [name] [id number] Date: 04/23/2016 Reason for consult: Initial assessment   Maternal Data Has patient been taught Hand Expression?: Yes Does the patient have breastfeeding experience prior to this delivery?: Yes  Feeding Feeding Type: Breast Fed Length of feed: 25 min  LATCH Score/Interventions       Type of Nipple: Everted at rest and after stimulation  Comfort (Breast/Nipple): Soft / non-tender     Intervention(s): Skin to skin;Position options;Support Pillows;Breastfeeding basics reviewed     Lactation Tools Discussed/Used Tools: Shells;Pump Shell Type: Inverted Breast pump type: Manual Pump Review: Setup, frequency, and cleaning;Milk Storage Initiated by:: Allayne Stack RN Date initiated:: 04/23/16   Consult Status Consult Status: Follow-up Date: 04/23/16 Follow-up type: In-patient    Renee [name] 04/23/2016, 4:36 AM

## 2016-04-23 NOTE — Lactation Note (Signed)
This note was copied from a baby's chart. Lactation Consultation Note  Patient Name: Renee [name] [id number] Date: 04/23/2016 Reason for consult: Follow-up assessment  Baby 23 hours old and has been to the breast several times, but the latches have been on and off.  LC changed a medium stool ( mec) and placed the baby skin to skin. Baby awake, latched right on , LC assisted to flip  Upper lip to flanged position, and eased down chin. Few swallows noted and sustaining latch, sluggish pattern requiring Lots of stimulation to keep the baby in a sucking pattern. With breast compressions, noted few more swallows.  Baby being non - nutritive and LC released suction, woke baby up, burped and re- latched with depth and more swallows  noted and more active pattern for 5 mins and fell asleep. Baby skin to skin in football position.  LC reviewed feeding behaviors with infants > 24  And beyond.  Mom is an experienced breast feeding mother 6 months max with her 1st baby , struggled with her 2nd , and had to change  To bottle due to Gut tx medications.  LC discussed with MBU RN Burman Blacksmith - LC findings at consult , and due to mom's loss of blood, elevated temperature, and being  On Triple Antibiotic if the baby continues to be sluggish or swallows don't increase to set up the DEBP and have mom post pump  After feedings.    Maternal Data Has patient been taught Hand Expression?: Yes (small tiny drop from the left breast )  Feeding Feeding Type: Breast Fed Length of feed: 7 min  LATCH Score/Interventions Latch: Grasps breast easily, tongue down, lips flanged, rhythmical sucking. Intervention(s): Adjust position;Assist with latch;Breast massage;Breast compression  Audible Swallowing: A few with stimulation  Type of Nipple: Everted at rest and after stimulation  Comfort (Breast/Nipple): Soft / non-tender     Hold (Positioning): Assistance needed to correctly position infant at breast and maintain  latch. Intervention(s): Breastfeeding basics reviewed;Support Pillows;Position options;Skin to skin  LATCH Score: 8  Lactation Tools Discussed/Used     Consult Status Consult Status: Follow-up Date: 04/24/16 Follow-up type: In-patient    Myer Haff 04/23/2016, 2:23 PM

## 2016-04-23 NOTE — Progress Notes (Signed)
Dr. Garwin Brothers contacted about patient's blood pressure ad dizziness with standing. No new orders at this time. MD to come see patient.

## 2016-04-23 NOTE — Addendum Note (Signed)
Addendum  created 04/23/16 0754 by Rayvon Char, CRNA   Modules edited: Clinical Notes   Clinical Notes:  File: [id number]

## 2016-04-23 NOTE — Progress Notes (Signed)
POSTOPERATIVE DAY # 1 S/P CS   S:         Reports feeling nausea and dizziness this am - no pain             Tolerating po intake / + nausea / no vomiting / no flatus / no BM             Bleeding is light             Pain controlled with motrin and oxycodone             Up ad lib / ambulatory/ voiding QS  Newborn breast feeding   O:  VS: BP 84/41 mmHg  Pulse 50  Temp(Src) 97.5 F (36.4 C) (Oral)  Resp 18  Ht 5' 2.5" (1.588 m)  Wt 92.588 kg (204 lb 1.9 oz)  BMI 36.72 kg/m2  SpO2 94%  Breastfeeding? Unknown                          BP baseline 100/60 prenatally - since surgery range 83-104 / 41-55  LABS:               Recent Labs  04/22/16 1520 04/23/16 0615  WBC 25.9* 12.2*  HGB 11.3* 9.8*  PLT 357 314               Bloodtype: --/--/AB POS (07/06 1730)                         I&O: Intake/Output      07/06 0701 - 07/07 0700 07/07 0701 - 07/08 0700   P.O. 3120    I.V. (mL/kg) 3145.8 (34)    Total Intake(mL/kg) 6265.8 (67.7)    Urine (mL/kg/hr) 2650 800 (2)   Blood 1000    Total Output 3650 800   Net +2615.8 -800                     Physical Exam:             Alert and Oriented X3  Lungs: Clear and unlabored  Heart: regular rate and rhythm / no mumurs  Abdomen: soft, non-tender, non-distended             Fundus: firm, non-tender, Ueven             Dressing intact             Incision:  approximated with suture / no erythema / no ecchymosis / no drainage             Foley dark yellow ( reported clear earlier) - no drinking much at bedside due to nausea  Perineum: intact  Lochia: light  Extremities: trace edema, no calf pain or tenderness, SCD  A:        POD # 1 S/P CS for NRFHR            Appropriate blood loss post-op - mild dehydration likely etiology of hypotension  P:        Routine postoperative care              IV fluid bolus - encourage oral intake             Zantac to reduce acid / nausea - already received antiemetic             Repeat  orthostatics in 4 hours   Ragna Kramlich CNM,  MSN, [facility] 04/23/2016, 11:14 AM

## 2016-04-24 LAB — CULTURE, OB URINE: Special Requests: NORMAL

## 2016-04-24 MED ORDER — MAGNESIUM OXIDE 400 (241.3 MG) MG PO TABS
400.0000 mg | ORAL_TABLET | Freq: Every day | ORAL | Status: DC
  Administered 2016-04-25: 400 mg via ORAL
  Filled 2016-04-24 (×3): qty 1

## 2016-04-24 NOTE — Progress Notes (Signed)
POSTOPERATIVE DAY # 2 S/P CS  S:         Reports feeling better today - no dizziness or nausea             Tolerating po intake / no nausea / no vomiting / no flatus / no BM             Bleeding is light             Pain controlled with motrin             Up ad lib / ambulatory/ voiding QS  Newborn breast feeding  O:  VS: BP 90/54 mmHg  Pulse 86  Temp(Src) 97.8 F (36.6 C) (Oral)  Resp 18  Ht 5' 2.5" (1.588 m)  Wt 92.588 kg (204 lb 1.9 oz)  BMI 36.72 kg/m2  SpO2 99%  Breastfeeding? Unknown   LABS:               Recent Labs  04/23/16 0615 04/23/16 1553  WBC 12.2* 8.1  HGB 9.8* 9.7*  PLT 314 308               Bloodtype: --/--/AB POS (07/06 1730)                                       I&O: Intake/Output      07/07 0701 - 07/08 0700 07/08 0701 - 07/09 0700   P.O.     I.V. (mL/kg)     Total Intake(mL/kg)     Urine (mL/kg/hr) 5600 (2.5)    Blood     Total Output 5600     Net -5600                       Physical Exam:             Alert and Oriented X3  Lungs: Clear and unlabored  Heart: regular rate and rhythm / no mumurs  Abdomen: soft, non-tender, non-distended, active BS             Fundus: firm, non-tender, U-1             Dressing intact              Incision:  approximated with suture / no erythema / no ecchymosis / no drainage  Perineum: intact  Lochia: light  Extremities: no edema, no calf pain or tenderness, negative Homans  A:        POD # 2 S/P CS            Hx chorioamnionitis - resolving               Urine culture - GBS / blood cultures no growth in 2 days                afebrile  P:        Routine postoperative care              DC ABX             Ambulate             Iron and magnesium   Artelia Laroche CNM, MSN, FACNM 04/24/2016, 12:32 PM

## 2016-04-25 MED ORDER — MAGNESIUM OXIDE 400 (241.3 MG) MG PO TABS: 400.0000 mg | ORAL_TABLET | Freq: Every day | ORAL | Status: DC

## 2016-04-25 MED ORDER — FERROUS SULFATE 325 (65 FE) MG PO TABS: 325.0000 mg | ORAL_TABLET | Freq: Every day | ORAL | Status: DC

## 2016-04-25 MED ORDER — OXYCODONE HCL 5 MG PO TABS
5.0000 mg | ORAL_TABLET | ORAL | Status: DC | PRN
Start: 2016-04-25 — End: 2016-06-18

## 2016-04-25 MED ORDER — IBUPROFEN 600 MG PO TABS: 600.0000 mg | ORAL_TABLET | Freq: Four times a day (QID) | ORAL | Status: DC

## 2016-04-25 NOTE — Progress Notes (Signed)
POSTOPERATIVE DAY # 3 S/P CS   S:         Reports feeling so much better             Tolerating po intake / no nausea / no vomiting / + flatus / no BM             Bleeding is light             Pain controlled with motrin and percocet             Up ad lib / ambulatory/ voiding QS  Newborn breast feeding   O:  VS: BP 93/48 mmHg  Pulse 69  Temp(Src) 98.2 F (36.8 C) (Oral)  Resp 19  Ht 5' 2.5" (1.588 m)  Wt 92.588 kg (204 lb 1.9 oz)  BMI 36.72 kg/m2  SpO2 99%  Breastfeeding? Unknown   LABS:               Recent Labs  04/23/16 0615 04/23/16 1553  WBC 12.2* 8.1  HGB 9.8* 9.7*  PLT 314 308               Bloodtype: --/--/AB POS (07/06 1730)                 Physical Exam:             Alert and Oriented X3  Abdomen: soft, non-tender, non-distended             Fundus: firm, non-tender, U-1             Dressing intact honeycomb              Incision:  approximated with suture / no erythema / no ecchymosis / no drainage  Perineum: intact  Lochia: light  Extremities: no edema, no calf pain or tenderness, negative Homans  A:        POD # 3 S/P CS             P:        Routine postoperative care              DC home - WOB booklet / instructions reviewed   Artelia Laroche CNM, MSN, [place] 04/25/2016, 11:58 AM

## 2016-04-25 NOTE — Discharge Summary (Signed)
POSTOPERATIVE DISCHARGE SUMMARY:  Patient IDDeona [name] MRN: [id number] DOB/AGE: May 03, 1971 45 y.o.  Admit date: 04/22/2016 Admission Diagnoses: 38.2 weeks / NRFHR with BPP 2/8  / chorioamnionitis  Discharge date:  04/25/2016 Discharge Diagnoses: POD 3 s/p cesarean section / resolving chorioamnionits  Prenatal history: [id number]   EDC : 05/04/2016, by Other Basis  Prenatal care at [facility]  Primary provider :Dellis Filbert Prenatal course complicated by +GBS / hx HSV  Prenatal Labs: ABO, Rh: --/--/AB POS (07/06 1730)  Antibody: NEG (07/06 1730) Rubella:   Immune RPR: Non Reactive (07/06 1520)  HBsAg:   negative HIV:   NR GBS:   POSITIVE  Medical / Surgical History :  Past medical history:  Past Medical History  Diagnosis Date  . HSV infection     Has never had an outbreak on valtrex, fever blister  . Unspecified iridocyclitis     right eye  . Preglaucoma, unspecified     elevated IOP/  right eye  . GERD (gastroesophageal reflux disease)     Tums prn, worse in pregnancy  . Miscarriage   . Uveitis     Past surgical history:  Past Surgical History  Procedure Laterality Date  . Cataract extraction w/ intraocular lens implant Right APRIL 2014  . Anal fissure repair  AGE 62  . Dilation and evacuation N/A 08/21/2013    miscarriage  . Cesarean section N/A 04/22/2016    Procedure: CESAREAN SECTION;  Surgeon: Servando Salina, MD;  Location: [place];  Service: Obstetrics;  Laterality: N/A;    Family History:  Family History  Problem Relation Age of Onset  . Diabetes Mother   . Hypertension Mother   . Diabetes Father   . Hypertension Father   . Cancer Father     Colon 80, lung- former smoker 57  . Hyperlipidemia Father     mother    Social History:  reports that she has never smoked. She has never used smokeless tobacco. She reports that she does not drink alcohol or use illicit drugs.  Allergies: Review of patient's allergies indicates no  known allergies.   Current Medications at time of admission:  Prior to Admission medications   Medication Sig Start Date End Date Taking? Authorizing Provider  calcium carbonate (TUMS - DOSED IN MG ELEMENTAL CALCIUM) 500 MG chewable tablet Chew 3 tablets by mouth 2 (two) times daily as needed for indigestion or heartburn.   Yes Historical Provider, MD  Prenatal Vit-Fe Fumarate-FA (PRENATAL MULTIVITAMIN) TABS tablet Take 1 tablet by mouth daily at 12 noon.   Yes Historical Provider, MD  valACYclovir (VALTREX) 1000 MG tablet Take 1,000 mg by mouth daily.   Yes Historical Provider, MD    Intrapartum Course:  Admit for CS due to [facility] and fetal testing with BPP 2/8  Complicated by:  Presumed chorioamnioitis Interventions required: cesarean delivery / triple antibiotic therapy/urine & blood culture obtained  Procedures: Cesarean section delivery on 76/2017with delivery of viable female newborn by Dr Garwin Brothers   See operative report for further details APGAR (1 MIN): 8   APGAR (5 MINS): 9    Postoperative / postpartum course:  Uncomplicated with discharge on POD 3 No growth in blood cultures by time of DC / urine culture mixed bacteria & (+) GBS Placental pathology pending  Discharge Instructions:  Discharged Condition: stable  Activity: pelvic rest x 6 weeks and postoperative restrictions x 2   Diet: routine  Medications:    Medication List    STOP  taking these medications        valACYclovir 1000 MG tablet  Commonly known as:  VALTREX      TAKE these medications        calcium carbonate 500 MG chewable tablet  Commonly known as:  TUMS - dosed in mg elemental calcium  Chew 3 tablets by mouth 2 (two) times daily as needed for indigestion or heartburn.     ferrous sulfate 325 (65 FE) MG tablet  Take 1 tablet (325 mg total) by mouth daily with breakfast.     ibuprofen 600 MG tablet  Commonly known as:  ADVIL,MOTRIN  Take 1 tablet (600 mg total) by mouth every 6 (six)  hours.     magnesium oxide 400 (241.3 Mg) MG tablet  Commonly known as:  MAG-OX  Take 1 tablet (400 mg total) by mouth daily.     oxyCODONE 5 MG immediate release tablet  Commonly known as:  Oxy IR/ROXICODONE  Take 1 tablet (5 mg total) by mouth every 4 (four) hours as needed (pain scale 4-7).     prenatal multivitamin Tabs tablet  Take 1 tablet by mouth daily at 12 noon.        Wound Care: keep clean and dry / remove honeycomb POD 5 Postpartum Instructions: [facility] discharge booklet - instructions reviewed  Discharge to: Home  Follow up :  Home visit in 1 week with home visiting nurse [facility] in 6 weeks for routine postpartum visit with Dr Dellis Filbert       Signed: Artelia Laroche CNM, MSN, [facility] 04/25/2016, 12:03 PM

## 2016-04-25 NOTE — Lactation Note (Signed)
This note was copied from a baby's chart. Lactation Consultation Note  Patient Name: Renee [name] [id number] Date: 04/25/2016 Reason for consult: Follow-up assessment;Infant weight loss;Other (Comment) (10 % weight loss )  Baby is 61 hours old and has been consistent at the breast and per mom breast are filling, feeling heavier,warmer.  Baby was feeding , and released as the consult started.  LC reviewed basics, mom denies sore nipples. Sore nipple and engorgement prevention and tx reviewed.  Mom has a hand pump for D/C.  LC also discussed when overly full to hand express off 10 -15 ml so baby can get to the creamy fat milk consistently  And always soften the 1st breast well prior to offering the 2nd breast. Will enhance weight gain.  Mother informed of post-discharge support and given phone number to the lactation department, including services for phone call  assistance; out-patient appointments; and breastfeeding support group. List of other breastfeeding resources in the community given  in the handout. Encouraged mother to call for problems or concerns related to breastfeeding.   Maternal Data Has patient been taught Hand Expression?: Yes (mom does well after baby fed and released )  Feeding Feeding Type:  (baby was feeding , and released at the start fof consult ) Length of feed: 15 min  LATCH Score/Interventions                Intervention(s): Breastfeeding basics reviewed     Lactation Tools Discussed/Used Tools: Shells;Pump (suing shells with great results ) Shell Type: Inverted Breast pump type: Manual   Consult Status Consult Status: Complete Date: 04/25/16    Myer Haff 04/25/2016, 1:11 PM

## 2016-04-27 LAB — CULTURE, BLOOD (ROUTINE X 2)
Culture: NO GROWTH
Culture: NO GROWTH

## 2016-04-28 ENCOUNTER — Inpatient Hospital Stay (HOSPITAL_COMMUNITY): Admission: RE | Admit: 2016-04-28 | Payer: 59 | Source: Ambulatory Visit

## 2016-06-09 MED FILL — LOTEMAX 0.5% EYE DROPS: 0.5 | 25 days supply | Qty: 5 | Fill #0

## 2016-06-10 DIAGNOSIS — H20021 Recurrent acute iridocyclitis, right eye: Secondary | ICD-10-CM | POA: Diagnosis not present

## 2016-06-15 DIAGNOSIS — H53002 Unspecified amblyopia, left eye: Secondary | ICD-10-CM | POA: Diagnosis not present

## 2016-06-15 DIAGNOSIS — H35372 Puckering of macula, left eye: Secondary | ICD-10-CM | POA: Diagnosis not present

## 2016-06-15 DIAGNOSIS — H15001 Unspecified scleritis, right eye: Secondary | ICD-10-CM | POA: Diagnosis not present

## 2016-06-15 DIAGNOSIS — H15101 Unspecified episcleritis, right eye: Secondary | ICD-10-CM | POA: Diagnosis not present

## 2016-06-15 DIAGNOSIS — Z961 Presence of intraocular lens: Secondary | ICD-10-CM | POA: Diagnosis not present

## 2016-06-15 DIAGNOSIS — H40051 Ocular hypertension, right eye: Secondary | ICD-10-CM | POA: Diagnosis not present

## 2016-06-18 ENCOUNTER — Encounter: Payer: Self-pay | Admitting: Family Medicine

## 2016-06-18 ENCOUNTER — Ambulatory Visit (INDEPENDENT_AMBULATORY_CARE_PROVIDER_SITE_OTHER): Payer: 59 | Admitting: Family Medicine

## 2016-06-18 VITALS — BP 100/82 | HR 72 | Temp 97.5°F | Wt 189.6 lb

## 2016-06-18 DIAGNOSIS — H8110 Benign paroxysmal vertigo, unspecified ear: Secondary | ICD-10-CM

## 2016-06-18 DIAGNOSIS — R42 Dizziness and giddiness: Secondary | ICD-10-CM | POA: Diagnosis not present

## 2016-06-18 DIAGNOSIS — H8113 Benign paroxysmal vertigo, bilateral: Secondary | ICD-10-CM | POA: Diagnosis not present

## 2016-06-18 NOTE — Progress Notes (Signed)
Subjective:  Renee [name] is a 45 y.o. year old very pleasant female patient who presents for/with See problem oriented charting ROS- see any ROS included in HPI as well.   Past Medical History-  Patient Active Problem List   Diagnosis Date Noted  . Uveitis     Priority: High  . Family history of colon cancer 08/21/2015    Priority: Low  . GERD (gastroesophageal reflux disease)     Priority: Low  . Postpartum care following cesarean delivery (7/6) 04/23/2016  . Chorioamnionitis, delivered, current hospitalization 04/22/2016    Medications- reviewed and updated Current Outpatient Prescriptions  Medication Sig Dispense Refill  . calcium carbonate (TUMS - DOSED IN MG ELEMENTAL CALCIUM) 500 MG chewable tablet Chew 3 tablets by mouth 2 (two) times daily as needed for indigestion or heartburn.    . ferrous sulfate 325 (65 FE) MG tablet Take 1 tablet (325 mg total) by mouth daily with breakfast. 45 tablet 0  . ibuprofen (ADVIL,MOTRIN) 600 MG tablet Take 1 tablet (600 mg total) by mouth every 6 (six) hours. 30 tablet 0  . magnesium oxide (MAG-OX) 400 (241.3 Mg) MG tablet Take 1 tablet (400 mg total) by mouth daily. 45 tablet 0  . Prenatal Vit-Fe Fumarate-FA (PRENATAL MULTIVITAMIN) TABS tablet Take 1 tablet by mouth daily at 12 noon.    . valACYclovir (VALTREX) 1000 MG tablet Take 1,000 mg by mouth 2 (two) times daily.     No current facility-administered medications for this visit.     Objective: BP 100/82 (BP Location: Left Arm, Patient Position: Sitting, Cuff Size: Normal)   Pulse 72   Temp 97.5 F (36.4 C) (Oral)   Wt 189 lb 9.6 oz (86 kg)   SpO2 97%   Breastfeeding? Yes   BMI 34.13 kg/m  Gen: NAD, resting comfortably, fatigued appearing CV: RRR no murmurs rubs or gallops Lungs: CTAB no crackles, wheeze, rhonchi Abdomen: soft/nontender/nondistended/normal bowel sounds. No rebound or guarding.  Ext: no edema Skin: warm, dry Neuro: CN II-XII intact, sensation and reflexes normal  throughout, 5/5 muscle strength in bilateral upper and lower extremities. Normal finger to nose. Normal rapid alternating movements. No pronator drift. Normal romberg. Normal gait.   Assessment/Plan:   BPPV S:vertigo episodes started while pregnant likely around 6-7 months pregnant and lasted overnight the first time- had a lot of vomiting. Husband did maneuvers for her and help. Was with head movement. About 48 hours. As long as laying down not movint feeels fine. Episodes started again last Sunday (day 5-6 now). Head movement brings on, resting and not moving head resolves symptoms. Often if someone calls her and turns head. Tried meclizine  On Sunday night- pumped and dumped. Took it one more time next day- feels better- still coming but much better.   6 weeks postpartum, breastfeeding.  Ros- no tinnitus, ear fullness, hearing loss. Some mild sweatiness at night postpartum. No chest pain or shortness of breath. No ear pain or drainage.  A/P: Classic BPPV- patient preferred not to do epley maneuvers today but other symptoms fit perfectly. Patient was set up with an appointment for vestibular rehab at 3 pm today through with [facility]. She wants to avoid further meclizine due to need to pump and dump.    Return precautions advised.  Garret Reddish, MD

## 2016-06-18 NOTE — Progress Notes (Signed)
Pre visit review using our clinic review tool, if applicable. No additional management support is needed unless otherwise documented below in the visit note. 

## 2016-06-18 NOTE — Patient Instructions (Signed)
Refer for vestibular rehab 3pm today  Pump and dump was right thing to do   Benign Positional Vertigo Vertigo is the feeling that you or your surroundings are moving when they are not. Benign positional vertigo is the most common form of vertigo. The cause of this condition is not serious (is benign). This condition is triggered by certain movements and positions (is positional). This condition can be dangerous if it occurs while you are doing something that could endanger you or others, such as driving.  CAUSES In many cases, the cause of this condition is not known. It may be caused by a disturbance in an area of the inner ear that helps your brain to sense movement and balance. This disturbance can be caused by a viral infection (labyrinthitis), head injury, or repetitive motion. RISK FACTORS This condition is more likely to develop in:  Women.  People who are 23 years of age or older. SYMPTOMS Symptoms of this condition usually happen when you move your head or your eyes in different directions. Symptoms may start suddenly, and they usually last for less than a minute. Symptoms may include:  Loss of balance and falling.  Feeling like you are spinning or moving.  Feeling like your surroundings are spinning or moving.  Nausea and vomiting.  Blurred vision.  Dizziness.  Involuntary eye movement (nystagmus). Symptoms can be mild and cause only slight annoyance, or they can be severe and interfere with daily life. Episodes of benign positional vertigo may return (recur) over time, and they may be triggered by certain movements. Symptoms may improve over time. DIAGNOSIS This condition is usually diagnosed by medical history and a physical exam of the head, neck, and ears. You may be referred to a health care provider who specializes in ear, nose, and throat (ENT) problems (otolaryngologist) or a provider who specializes in disorders of the nervous system (neurologist). You may have  additional testing, including:  MRI.  A CT scan.  Eye movement tests. Your health care provider may ask you to change positions quickly while he or she watches you for symptoms of benign positional vertigo, such as nystagmus. Eye movement may be tested with an electronystagmogram (ENG), caloric stimulation, the Dix-Hallpike test, or the roll test.  An electroencephalogram (EEG). This records electrical activity in your brain.  Hearing tests. TREATMENT Usually, your health care provider will treat this by moving your head in specific positions to adjust your inner ear back to normal. Surgery may be needed in severe cases, but this is rare. In some cases, benign positional vertigo may resolve on its own in 2-4 weeks. HOME CARE INSTRUCTIONS Safety  Move slowly.Avoid sudden body or head movements.  Avoid driving.  Avoid operating heavy machinery.  Avoid doing any tasks that would be dangerous to you or others if a vertigo episode would occur.  If you have trouble walking or keeping your balance, try using a cane for stability. If you feel dizzy or unstable, sit down right away.  Return to your normal activities as told by your health care provider. Ask your health care provider what activities are safe for you. General Instructions  Take over-the-counter and prescription medicines only as told by your health care provider.  Avoid certain positions or movements as told by your health care provider.  Drink enough fluid to keep your urine clear or pale yellow.  Keep all follow-up visits as told by your health care provider. This is important. SEEK MEDICAL CARE IF:  You have  a fever.  Your condition gets worse or you develop new symptoms.  Your family or friends notice any behavioral changes.  Your nausea or vomiting gets worse.  You have numbness or a "pins and needles" sensation. SEEK IMMEDIATE MEDICAL CARE IF:  You have difficulty speaking or moving.  You are always  dizzy.  You faint.  You develop severe headaches.  You have weakness in your legs or arms.  You have changes in your hearing or vision.  You develop a stiff neck.  You develop sensitivity to light.   This information is not intended to replace advice given to you by your health care provider. Make sure you discuss any questions you have with your health care provider.   Document Released: 07/12/2006 Document Revised: 06/25/2015 Document Reviewed: 01/27/2015 Elsevier Interactive Patient Education [employer].

## 2016-07-13 DIAGNOSIS — Z961 Presence of intraocular lens: Secondary | ICD-10-CM | POA: Diagnosis not present

## 2016-07-13 DIAGNOSIS — H40051 Ocular hypertension, right eye: Secondary | ICD-10-CM | POA: Diagnosis not present

## 2016-07-13 DIAGNOSIS — H53002 Unspecified amblyopia, left eye: Secondary | ICD-10-CM | POA: Diagnosis not present

## 2016-07-13 DIAGNOSIS — H15001 Unspecified scleritis, right eye: Secondary | ICD-10-CM | POA: Diagnosis not present

## 2016-07-13 DIAGNOSIS — H35372 Puckering of macula, left eye: Secondary | ICD-10-CM | POA: Diagnosis not present

## 2016-07-13 DIAGNOSIS — H15101 Unspecified episcleritis, right eye: Secondary | ICD-10-CM | POA: Diagnosis not present

## 2016-07-14 DIAGNOSIS — Z30430 Encounter for insertion of intrauterine contraceptive device: Secondary | ICD-10-CM | POA: Diagnosis not present

## 2016-08-09 ENCOUNTER — Encounter: Payer: 59 | Attending: Family Medicine | Admitting: Skilled Nursing Facility1

## 2016-08-09 ENCOUNTER — Encounter: Payer: Self-pay | Admitting: Skilled Nursing Facility1

## 2016-08-09 DIAGNOSIS — Z713 Dietary counseling and surveillance: Secondary | ICD-10-CM | POA: Diagnosis not present

## 2016-08-09 NOTE — Progress Notes (Signed)
Patient was seen on 08/09/2016 for the Weight Loss Class at the [facility]. The following learning objectives were met by the patient during this class:   Describe healthy choices in each food group  Describe portion size of foods  Use plate method for meal planning  Demonstrate how to read Nutrition Facts food label  Set realistic goals for weight loss, diet changes, and physical activity.   Goals:  1. Make healthy food choices in each food group.  2. Reduce portion size of foods.  3. Increase fruit and vegetable intake.  4. Use plate method for meal planning.  5. Increase physical activity.    Handouts given:   1. Nutrition Strategies for Weight Loss   2. Low Sodium seasoning    4. Weight Management Recipe Resources   5. Bake, Broil, [place]

## 2016-08-11 DIAGNOSIS — Z30431 Encounter for routine checking of intrauterine contraceptive device: Secondary | ICD-10-CM | POA: Diagnosis not present

## 2016-10-25 ENCOUNTER — Ambulatory Visit (INDEPENDENT_AMBULATORY_CARE_PROVIDER_SITE_OTHER): Payer: 59 | Admitting: Family Medicine

## 2016-10-25 ENCOUNTER — Encounter: Payer: Self-pay | Admitting: Family Medicine

## 2016-10-25 VITALS — BP 116/70 | HR 82 | Temp 97.7°F | Ht 62.5 in | Wt 191.0 lb

## 2016-10-25 DIAGNOSIS — R42 Dizziness and giddiness: Secondary | ICD-10-CM | POA: Diagnosis not present

## 2016-10-25 DIAGNOSIS — R5383 Other fatigue: Secondary | ICD-10-CM | POA: Diagnosis not present

## 2016-10-25 LAB — COMPREHENSIVE METABOLIC PANEL
ALT: 24 U/L (ref 0–35)
AST: 15 U/L (ref 0–37)
Albumin: 4.5 g/dL (ref 3.5–5.2)
Alkaline Phosphatase: 55 U/L (ref 39–117)
BUN: 19 mg/dL (ref 6–23)
CO2: 29 mEq/L (ref 19–32)
Calcium: 9.5 mg/dL (ref 8.4–10.5)
Chloride: 104 mEq/L (ref 96–112)
Creatinine, Ser: 0.62 mg/dL (ref 0.40–1.20)
GFR: 111.37 mL/min (ref >60.00)
Glucose, Bld: 89 mg/dL (ref 70–99)
Potassium: 4.3 mEq/L (ref 3.5–5.1)
Sodium: 141 mEq/L (ref 135–145)
Total Bilirubin: 0.3 mg/dL (ref 0.2–1.2)
Total Protein: 7.4 g/dL (ref 6.0–8.3)

## 2016-10-25 LAB — CBC
HCT: 38.8 % (ref 36.0–46.0)
Hemoglobin: 13.1 g/dL (ref 12.0–15.0)
MCHC: 33.8 g/dL (ref 30.0–36.0)
MCV: 87.2 fl (ref 78.0–100.0)
Platelets: 307 10*3/uL (ref 150.0–400.0)
RBC: 4.45 Mil/uL (ref 3.87–5.11)
RDW: 12.8 % (ref 11.5–15.5)
WBC: 6.7 10*3/uL (ref 4.0–10.5)

## 2016-10-25 LAB — TSH: TSH: 1.46 u[IU]/mL (ref 0.35–4.50)

## 2016-10-25 LAB — C-REACTIVE PROTEIN: CRP: 0.5 mg/dL (ref 0.5–20.0)

## 2016-10-25 LAB — SEDIMENTATION RATE: Sed Rate: 17 mm/hr (ref 0–20)

## 2016-10-25 NOTE — Patient Instructions (Signed)
Labs before you leave  We will call you within a week about your referral to MR brain. If you do not hear within 2 weeks, give [place] a call.   If you have worsening symptoms let us know and after MR we can touch base on further steps

## 2016-10-25 NOTE — Progress Notes (Signed)
Pre visit review using our clinic review tool, if applicable. No additional management support is needed unless otherwise documented below in the visit note. 

## 2016-10-25 NOTE — Progress Notes (Signed)
Subjective:  Renee [name] is a 46 y.o. year old very pleasant female patient who presents for/with See problem oriented charting ROS- vertigo episodes, fatigue noted, difficulty losing weight. No chest pain or shortness of breath. No night sweats   Past Medical History-  Patient Active Problem List   Diagnosis Date Noted  . Uveitis     Priority: High  . Family history of colon cancer 08/21/2015    Priority: Low  . GERD (gastroesophageal reflux disease)     Priority: Low  . Postpartum care following cesarean delivery (7/6) 04/23/2016  . Chorioamnionitis, delivered, current hospitalization 04/22/2016    Medications- reviewed and updated Current Outpatient Prescriptions  Medication Sig Dispense Refill  . calcium carbonate (TUMS - DOSED IN MG ELEMENTAL CALCIUM) 500 MG chewable tablet Chew 3 tablets by mouth 2 (two) times daily as needed for indigestion or heartburn.    Marland Kitchen ibuprofen (ADVIL,MOTRIN) 600 MG tablet Take 1 tablet (600 mg total) by mouth every 6 (six) hours. 30 tablet 0  . Prenatal Vit-Fe Fumarate-FA (PRENATAL MULTIVITAMIN) TABS tablet Take 1 tablet by mouth daily at 12 noon.     No current facility-administered medications for this visit.     Objective: BP 116/70 (BP Location: Left Arm, Patient Position: Sitting, Cuff Size: Large)   Pulse 82   Temp 97.7 F (36.5 C) (Oral)   Ht 5' 2.5" (1.588 m)   Wt 191 lb (86.6 kg)   SpO2 98%   Breastfeeding? Yes   BMI 34.38 kg/m  Gen: NAD, resting comfortably TM normal, oropharynx normal CV: RRR no murmurs rubs or gallops Lungs: CTAB no crackles, wheeze, rhonchi Abdomen: soft/nontender/nondistended/normal bowel sounds.  Ext: no edema Skin: warm, dry Neuro: CN II-XII intact, sensation and reflexes normal throughout, 5/5 muscle strength in bilateral upper and lower extremities. Normal finger to nose. Normal rapid alternating movements. No pronator drift. Normal romberg. Normal gait.   Assessment/Plan:  Severe Vertigo Fatigue S:  Patient continues to have several severe episodes of vertigo. She has had 3 severe episodes since September but probably 6 episodes total. She states with the more severe episodes Any movement severe vertigo- has to sit completely still. Emesis if moves. Couldn't walk to bathroom with these episodes- carried by husband. Most recent episode 8 days ago. Usually last about 2 days.Meclizine helps but has to pump and dump. Her baby is now 79 months old and vertigo makes it impossible to care for baby girl and 2 older sisters  Last visit we did vestibular rehab with [facility]. She states it helps but had a family member in PT who stated this can cause episodes up to 6 months. She instead instructed her on a face tapping maneuver, advised no sudden movements, sleep on 2 pillows at night which seems to help some. Sister has this and is on meclizine all the time apparently.   She also has really been struggling to lose weight post pregnancy despite eating 1800 calories a day. Some various joint aches. History of episcleritis and has had esr, crp checked in past and wants these checked in addition to cbc, cmp, tsh.  A/P: due to severity of episodes and continued recurrence, we opted to pursue MR brain at this time. Also with Fatigue and Struggling to lose weight - cbc, tsh, cmet , esr, crp ( in context of also various joint aches and episcleritis history)   Orders Placed This Encounter  Procedures  . MR Brain W Wo Contrast    Standing Status:  Future    Standing Expiration Date:   12/23/2017    Order Specific Question:   If indicated for the ordered procedure, I authorize the administration of contrast media per Radiology protocol    Answer:   Yes    Order Specific Question:   Reason for Exam (SYMPTOM  OR DIAGNOSIS REQUIRED)    Answer:   severe vertigo at least 3 episodes for 2 days in 6 months. vomiting. cannot walk to bathroom.    Order Specific Question:   Preferred imaging location?    Answer:   [facility] (table limit-500 lbs)    Order Specific Question:   What is the patient's sedation requirement?    Answer:   No Sedation    Order Specific Question:   Does the patient have a pacemaker or implanted devices?    Answer:   No  . CBC    [place]  . Comprehensive metabolic panel    [place]  . TSH    [place]  . Sedimentation rate      . C-reactive Protein   Return precautions advised.  Garret Reddish, MD

## 2016-10-29 ENCOUNTER — Other Ambulatory Visit: Payer: Self-pay | Admitting: Family Medicine

## 2016-10-29 ENCOUNTER — Ambulatory Visit (HOSPITAL_COMMUNITY)
Admission: RE | Admit: 2016-10-29 | Discharge: 2016-10-29 | Disposition: A | Discharge status: Home / Self Care | Payer: 59 | Source: Ambulatory Visit | Attending: Family Medicine | Admitting: Family Medicine

## 2016-10-29 DIAGNOSIS — R42 Dizziness and giddiness: Secondary | ICD-10-CM

## 2016-12-28 ENCOUNTER — Other Ambulatory Visit: Payer: Self-pay | Admitting: Gastroenterology

## 2016-12-28 DIAGNOSIS — Z800 Family history of malignant neoplasm of digestive organs: Secondary | ICD-10-CM | POA: Diagnosis not present

## 2016-12-28 DIAGNOSIS — R1011 Right upper quadrant pain: Secondary | ICD-10-CM | POA: Diagnosis not present

## 2016-12-28 DIAGNOSIS — E669 Obesity, unspecified: Secondary | ICD-10-CM | POA: Diagnosis not present

## 2017-01-05 ENCOUNTER — Encounter (HOSPITAL_COMMUNITY): Payer: Self-pay

## 2017-01-05 ENCOUNTER — Ambulatory Visit (HOSPITAL_COMMUNITY): Payer: 59

## 2017-01-05 ENCOUNTER — Encounter (HOSPITAL_COMMUNITY): Payer: 59

## 2017-03-09 IMAGING — US US MFM FETAL BPP W/O NON-STRESS
1 series · 15 of 20 positions shown · non-contrast
Comparison: none

[Series 1: us mfm fetal bpp w/o non-stress · 20 acquisitions, 15 frames shown]
[im 1/20]
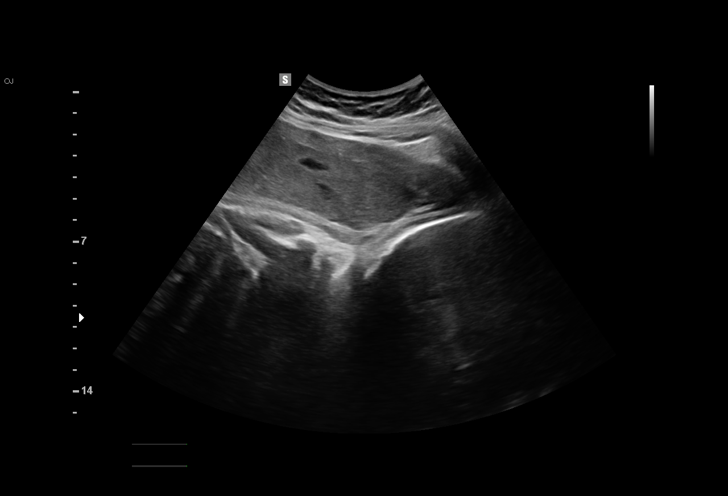
[im 3/20]
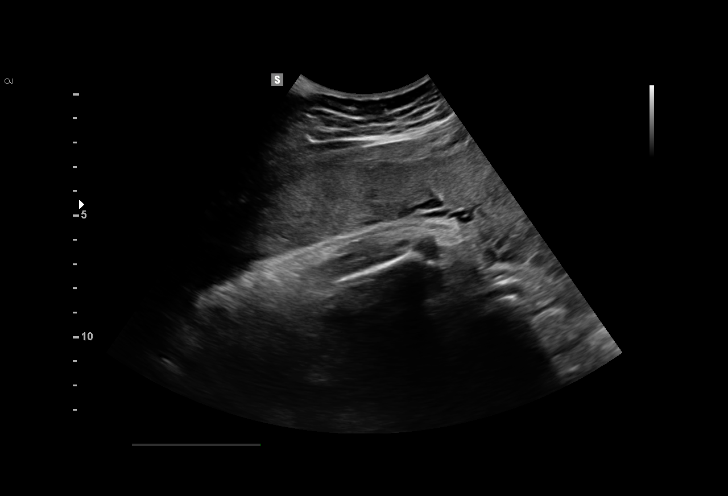
[im 4/20]
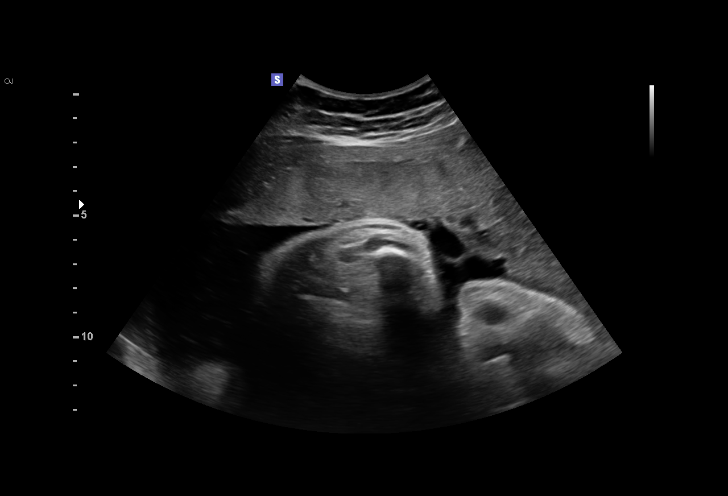
[im 5/20]
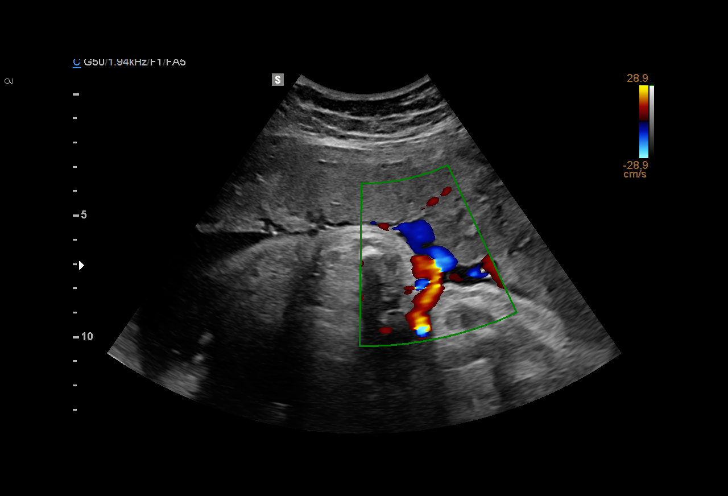
[im 7/20]
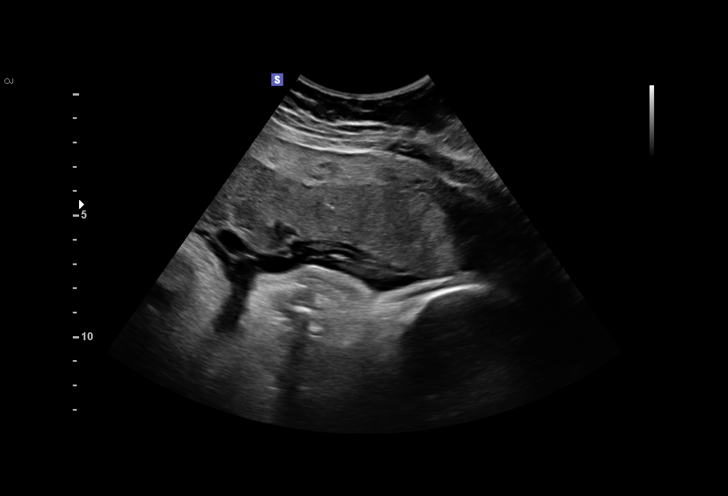
[im 8/20]
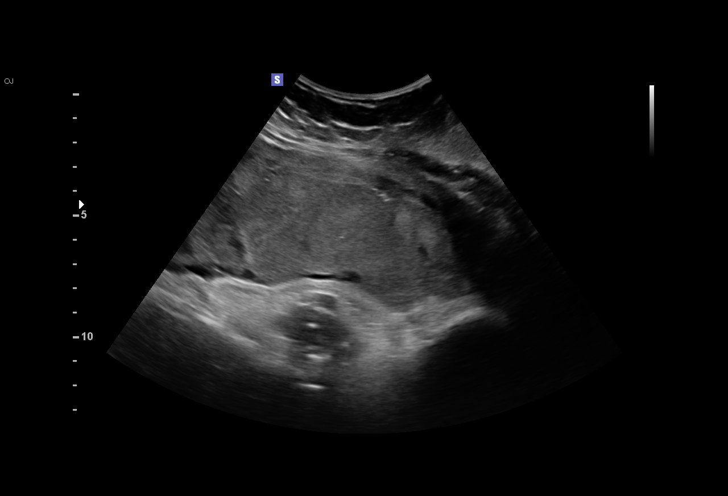
[im 9/20]
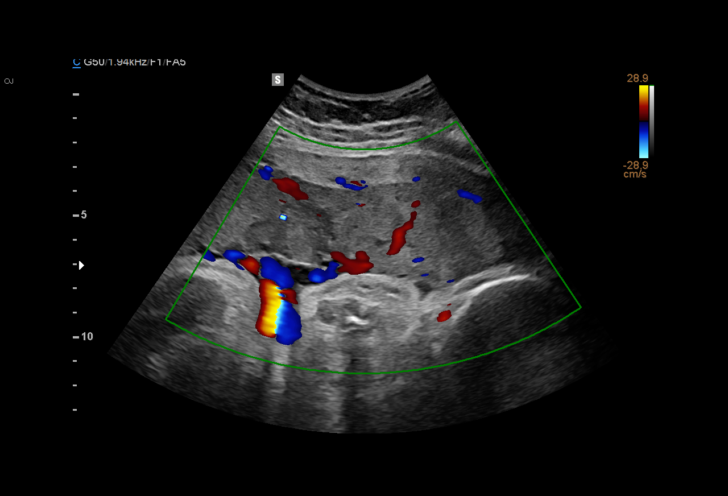
[im 11/20]
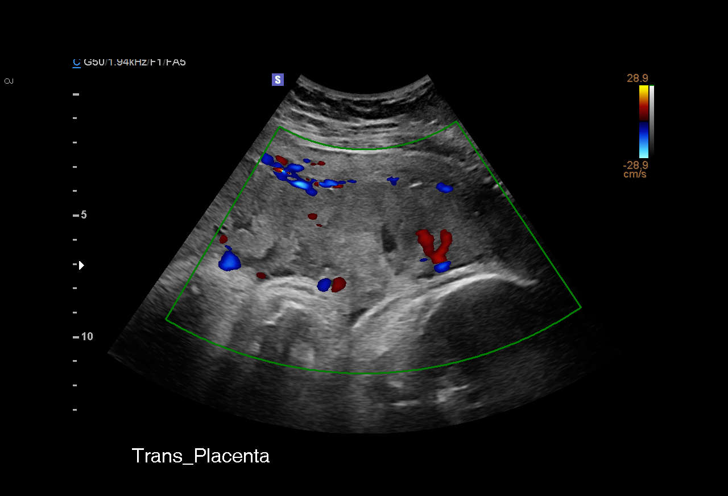
[im 12/20]
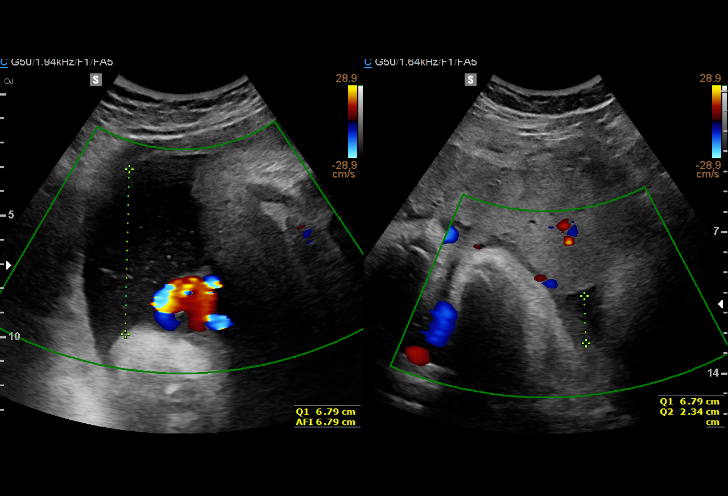
[im 13/20]
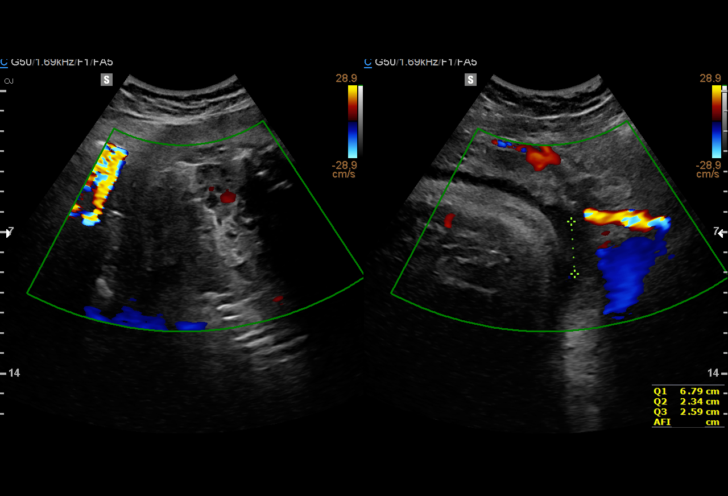
[im 15/20]
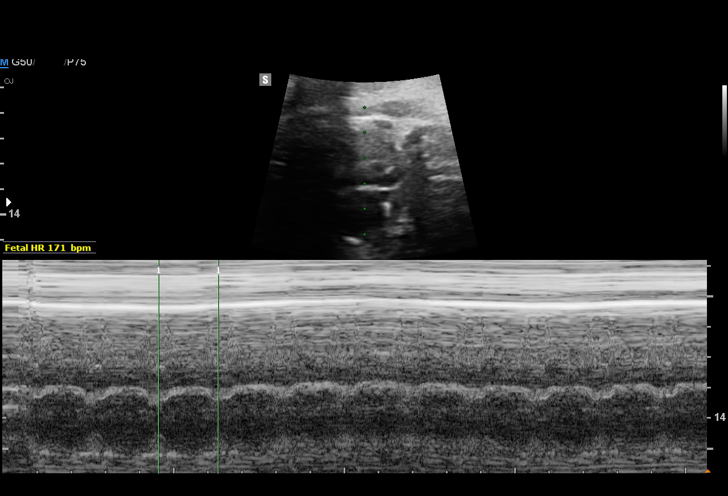
[im 16/20]
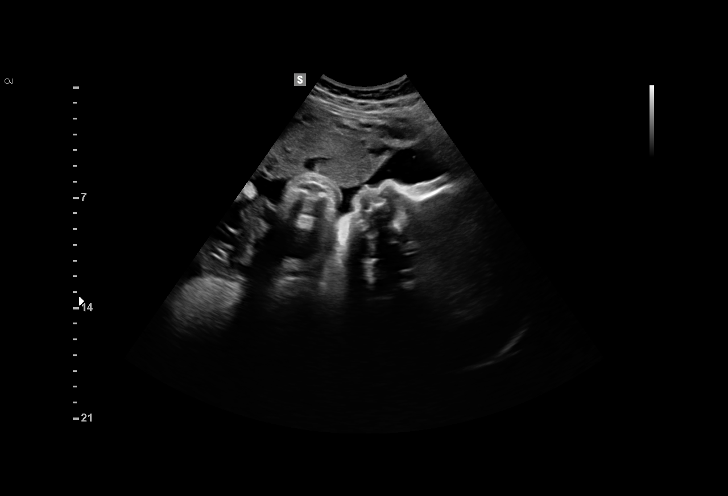
[im 17/20]
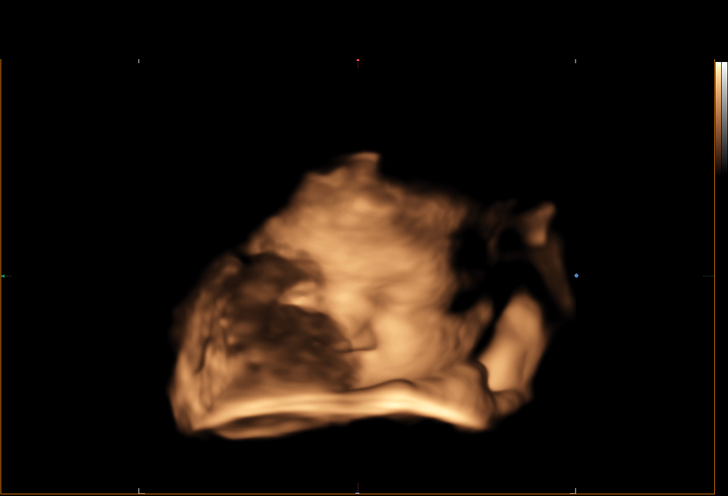
[im 19/20]
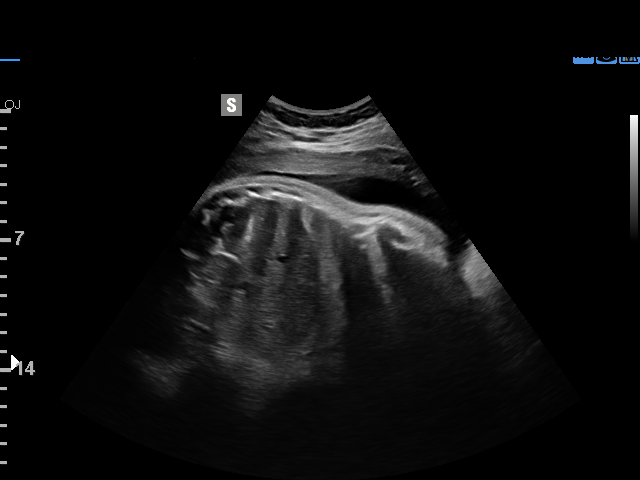
[im 20/20]
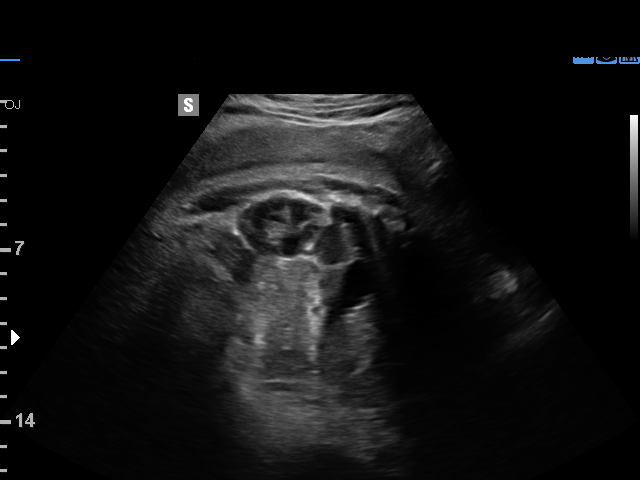

[15 of 20 positions shown; findings below may reference images not displayed]

[facility]

[address]               [id number]      [id number]     [id number]
MAELI
Indications

38 weeks gestation of pregnancy
Fetal Evaluation

Num Of Fetuses:     1
Fetal Heart         173
Rate(bpm):
Cardiac Activity:   Observed
Presentation:       Cephalic
Placenta:           Anterior, above cervical os
P. Cord Insertion:  Visualized

Amniotic Fluid
AFI FV:      Subjectively within normal limits

AFI Sum(cm)     %Tile       Largest Pocket(cm)
11.72           39

RUQ(cm)       RLQ(cm)       LUQ(cm)
6.79
Biophysical Evaluation

Amniotic F.V:   Within normal limits       F. Tone:        Not Observed
F. Movement:    Not Observed               Score:          [DATE]
F. Breathing:   Not Observed
Gestational Age

LMP:           38w 1d       Date:   07/30/15                 EDD:   05/05/16
Best:          38w 1d    Det. By:   LMP  (07/30/15)          EDD:   05/05/16
Impression

Single IUP at 38w 1d
BPP [DATE] (-2 for absent breathing, tone, < 3 fetal body
movements)
Normal amniotic fluid volume
Recommendations

Recommend delivery given current gestational age

## 2017-06-24 ENCOUNTER — Other Ambulatory Visit: Payer: Self-pay | Admitting: Gastroenterology

## 2017-06-24 DIAGNOSIS — R1011 Right upper quadrant pain: Secondary | ICD-10-CM

## 2017-06-24 NOTE — Progress Notes (Signed)
Renee Mcclenahan MD 

## 2017-07-14 DIAGNOSIS — H2000 Unspecified acute and subacute iridocyclitis: Secondary | ICD-10-CM | POA: Diagnosis not present

## 2017-07-14 MED FILL — PREDNISOLONE AC 1% EYE DROP: 1 | 50 days supply | Qty: 10 | Fill #0

## 2017-07-19 ENCOUNTER — Ambulatory Visit (HOSPITAL_COMMUNITY)
Admission: RE | Admit: 2017-07-19 | Discharge: 2017-07-19 | Disposition: A | Discharge status: Home / Self Care | Payer: 59 | Source: Ambulatory Visit | Attending: Gastroenterology | Admitting: Gastroenterology

## 2017-07-19 ENCOUNTER — Encounter (HOSPITAL_COMMUNITY)
Admission: RE | Admit: 2017-07-19 | Discharge: 2017-07-19 | Disposition: A | Discharge status: Home / Self Care | Payer: 59 | Source: Ambulatory Visit | Attending: Gastroenterology | Admitting: Gastroenterology

## 2017-07-19 DIAGNOSIS — K824 Cholesterolosis of gallbladder: Secondary | ICD-10-CM | POA: Diagnosis not present

## 2017-07-19 DIAGNOSIS — R1011 Right upper quadrant pain: Secondary | ICD-10-CM | POA: Diagnosis not present

## 2017-07-19 DIAGNOSIS — R9389 Abnormal findings on diagnostic imaging of other specified body structures: Secondary | ICD-10-CM | POA: Diagnosis not present

## 2017-07-19 DIAGNOSIS — K839 Disease of biliary tract, unspecified: Secondary | ICD-10-CM | POA: Diagnosis not present

## 2017-07-19 MED ORDER — TECHNETIUM TC 99M MEBROFENIN IV KIT
5.0000 | PACK | Freq: Once | INTRAVENOUS | Status: AC | PRN
  Administered 2017-07-19: 5 via INTRAVENOUS

## 2017-07-21 DIAGNOSIS — H2000 Unspecified acute and subacute iridocyclitis: Secondary | ICD-10-CM | POA: Diagnosis not present

## 2017-07-25 DIAGNOSIS — K2970 Gastritis, unspecified, without bleeding: Secondary | ICD-10-CM | POA: Diagnosis not present

## 2017-07-25 DIAGNOSIS — R1011 Right upper quadrant pain: Secondary | ICD-10-CM | POA: Diagnosis not present

## 2017-07-25 DIAGNOSIS — K828 Other specified diseases of gallbladder: Secondary | ICD-10-CM | POA: Diagnosis not present

## 2017-07-29 ENCOUNTER — Other Ambulatory Visit: Payer: Self-pay | Admitting: Surgery

## 2017-07-29 DIAGNOSIS — K811 Chronic cholecystitis: Secondary | ICD-10-CM | POA: Diagnosis not present

## 2017-08-24 DIAGNOSIS — R1011 Right upper quadrant pain: Secondary | ICD-10-CM | POA: Diagnosis not present

## 2017-08-24 DIAGNOSIS — K828 Other specified diseases of gallbladder: Secondary | ICD-10-CM | POA: Diagnosis not present

## 2017-08-24 DIAGNOSIS — Z800 Family history of malignant neoplasm of digestive organs: Secondary | ICD-10-CM | POA: Diagnosis not present

## 2017-08-24 MED FILL — traMADol HCL 50 MG TABS: 50 | 30 days supply | Qty: 60 | Fill #0

## 2017-08-25 DIAGNOSIS — H2000 Unspecified acute and subacute iridocyclitis: Secondary | ICD-10-CM | POA: Diagnosis not present

## 2017-08-31 ENCOUNTER — Encounter (HOSPITAL_BASED_OUTPATIENT_CLINIC_OR_DEPARTMENT_OTHER): Payer: Self-pay | Admitting: *Deleted

## 2017-08-31 ENCOUNTER — Other Ambulatory Visit: Payer: Self-pay

## 2017-09-01 DIAGNOSIS — H2000 Unspecified acute and subacute iridocyclitis: Secondary | ICD-10-CM | POA: Diagnosis not present

## 2017-09-04 NOTE — H&P (Signed)
  [place]  Location: [facility] Patient #: [id number] DOB: October 02, 1971 Married / Language: English / Race: Refused to Report/Unreported Female   History of Present Illness (Janesia Joswick A. Ninfa Linden MD;   The patient is a 46 year old female who presents for evaluation of gall stones. This is a pleasant patient referred by Dr. Juanita Craver for evaluation of gallbladder problems. The patient has been actually having attacks of biliary colic since at least 2426. She has had ultrasounds in the past showing gallstones. Her most recent ultrasound showed gallbladder polyps. She had a HIDA scan showing a 0% gallbladder ejection fraction. She has nausea and right upper quadrant abdominal pain with bloating. Her pain is mild to moderate. It does not refer anywhere else. She has no emesis. Bowel movements are normal. She is otherwise healthy without complaints.   Allergies   No Known Drug Allergies   Allergies Reconciled   Medication History    PrednisoLONE Acetate (1% Suspension, Ophthalmic) Active. Medications Reconciled  Vitals  Weight: 183 lb Height: 62in Body Surface Area: 1.84 m Body Mass Index: 33.47 kg/m  Temp.: 98.52F  Pulse: 103 (Regular)  BP: 122/84 (Sitting, Left Arm, Standard)  Physical Exam   General Mental Status-Alert. General Appearance-Consistent with stated age. Hydration-Well hydrated. Voice-Normal.  Head and Neck Head-normocephalic, atraumatic with no lesions or palpable masses.  Eye Eyeball - Bilateral-Extraocular movements intact. Sclera/Conjunctiva - Bilateral-No scleral icterus.  Chest and Lung Exam Chest and lung exam reveals -quiet, even and easy respiratory effort with no use of accessory muscles and on auscultation, normal breath sounds, no adventitious sounds and normal vocal resonance. Inspection Chest Wall - Normal. Back - normal.  Cardiovascular Cardiovascular examination reveals -on  palpation PMI is normal in location and amplitude, no palpable S3 or S4. Normal cardiac borders., normal heart sounds, regular rate and rhythm with no murmurs, carotid auscultation reveals no bruits and normal pedal pulses bilaterally.  Abdomen Inspection Inspection of the abdomen reveals - No Hernias. Skin - Scar - no surgical scars. Palpation/Percussion Palpation and Percussion of the abdomen reveal - Soft, Non Tender, No Rebound tenderness, No Rigidity (guarding) and No hepatosplenomegaly. Auscultation Auscultation of the abdomen reveals - Bowel sounds normal.  Neurologic - Did not examine.  Musculoskeletal - Did not examine.    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 They're well aware of gallbladder disease. I suspect she has chronic cholecystitis and dyskinesia as well as possible stones. I recommend proceeding with a laparoscopic cholecystectomy and they are eager to proceed with this as well. I gave her literature regarding surgery. We discussed the risk of surgery in detail. These include but are not limited to bleeding, infection, bile duct injury, bile leak, injury to other structures, the need to convert to an open procedure, postoperative recovery, etc. She understands and agrees to proceed with surgery which will be scheduled

## 2017-09-05 ENCOUNTER — Other Ambulatory Visit: Payer: Self-pay

## 2017-09-05 ENCOUNTER — Ambulatory Visit (HOSPITAL_BASED_OUTPATIENT_CLINIC_OR_DEPARTMENT_OTHER): Payer: 59 | Admitting: Certified Registered"

## 2017-09-05 ENCOUNTER — Encounter (HOSPITAL_BASED_OUTPATIENT_CLINIC_OR_DEPARTMENT_OTHER)
Admission: RE | Disposition: A | Discharge status: Home / Self Care | Payer: Self-pay | Source: Ambulatory Visit | Attending: Surgery

## 2017-09-05 ENCOUNTER — Ambulatory Visit (HOSPITAL_BASED_OUTPATIENT_CLINIC_OR_DEPARTMENT_OTHER)
Admission: RE | Admit: 2017-09-05 | Discharge: 2017-09-05 | Disposition: A | Discharge status: Home / Self Care | Payer: 59 | Source: Ambulatory Visit | Attending: Surgery | Admitting: Surgery

## 2017-09-05 ENCOUNTER — Encounter (HOSPITAL_BASED_OUTPATIENT_CLINIC_OR_DEPARTMENT_OTHER): Payer: Self-pay | Admitting: Certified Registered"

## 2017-09-05 DIAGNOSIS — Z6832 Body mass index (BMI) 32.0-32.9, adult: Secondary | ICD-10-CM | POA: Diagnosis not present

## 2017-09-05 DIAGNOSIS — K219 Gastro-esophageal reflux disease without esophagitis: Secondary | ICD-10-CM | POA: Insufficient documentation

## 2017-09-05 DIAGNOSIS — K811 Chronic cholecystitis: Secondary | ICD-10-CM | POA: Diagnosis present

## 2017-09-05 DIAGNOSIS — E669 Obesity, unspecified: Secondary | ICD-10-CM | POA: Insufficient documentation

## 2017-09-05 DIAGNOSIS — H209 Unspecified iridocyclitis: Secondary | ICD-10-CM | POA: Diagnosis not present

## 2017-09-05 DIAGNOSIS — Z800 Family history of malignant neoplasm of digestive organs: Secondary | ICD-10-CM | POA: Diagnosis not present

## 2017-09-05 DIAGNOSIS — K8010 Calculus of gallbladder with chronic cholecystitis without obstruction: Secondary | ICD-10-CM | POA: Insufficient documentation

## 2017-09-05 DIAGNOSIS — K8044 Calculus of bile duct with chronic cholecystitis without obstruction: Secondary | ICD-10-CM | POA: Diagnosis not present

## 2017-09-05 DIAGNOSIS — K828 Other specified diseases of gallbladder: Secondary | ICD-10-CM | POA: Diagnosis not present

## 2017-09-05 HISTORY — PX: CHOLECYSTECTOMY: SHX55

## 2017-09-05 HISTORY — DX: Chronic cholecystitis: K81.1

## 2017-09-05 SURGERY — LAPAROSCOPIC CHOLECYSTECTOMY
Anesthesia: General

## 2017-09-05 MED ORDER — FENTANYL CITRATE (PF) 100 MCG/2ML IJ SOLN
INTRAMUSCULAR | Status: AC
  Filled 2017-09-05: qty 2

## 2017-09-05 MED ORDER — HYDROMORPHONE HCL 1 MG/ML IJ SOLN
INTRAMUSCULAR | Status: AC
  Filled 2017-09-05: qty 0.5

## 2017-09-05 MED ORDER — CEFAZOLIN SODIUM-DEXTROSE 2-4 GM/100ML-% IV SOLN
2.0000 g | INTRAVENOUS | Status: AC
  Administered 2017-09-05: 2 g via INTRAVENOUS

## 2017-09-05 MED ORDER — OXYCODONE HCL 5 MG PO TABS: 5.0000 mg | ORAL_TABLET | Freq: Four times a day (QID) | ORAL | 0 refills | Status: DC | PRN

## 2017-09-05 MED ORDER — ONDANSETRON 8 MG PO TBDP
8.0000 mg | ORAL_TABLET | Freq: Once | ORAL | Status: AC
  Administered 2017-09-05: 8 mg via ORAL

## 2017-09-05 MED ORDER — SUCCINYLCHOLINE CHLORIDE 200 MG/10ML IV SOSY
PREFILLED_SYRINGE | INTRAVENOUS | Status: AC
  Filled 2017-09-05: qty 10

## 2017-09-05 MED ORDER — FENTANYL CITRATE (PF) 100 MCG/2ML IJ SOLN
50.0000 ug | INTRAMUSCULAR | Status: AC | PRN
  Administered 2017-09-05: 25 ug via INTRAVENOUS
  Administered 2017-09-05 (×2): 50 ug via INTRAVENOUS

## 2017-09-05 MED ORDER — DEXAMETHASONE SODIUM PHOSPHATE 4 MG/ML IJ SOLN
INTRAMUSCULAR | Status: DC | PRN
  Administered 2017-09-05: 10 mg via INTRAVENOUS

## 2017-09-05 MED ORDER — METOCLOPRAMIDE HCL 5 MG/ML IJ SOLN: 10.0000 mg | Freq: Once | INTRAMUSCULAR | Status: DC | PRN

## 2017-09-05 MED ORDER — MIDAZOLAM HCL 2 MG/2ML IJ SOLN
1.0000 mg | INTRAMUSCULAR | Status: DC | PRN
  Administered 2017-09-05: 2 mg via INTRAVENOUS

## 2017-09-05 MED ORDER — PROPOFOL 10 MG/ML IV BOLUS
INTRAVENOUS | Status: DC | PRN
  Administered 2017-09-05: 200 mg via INTRAVENOUS

## 2017-09-05 MED ORDER — SCOPOLAMINE 1 MG/3DAYS TD PT72
1.0000 | MEDICATED_PATCH | Freq: Once | TRANSDERMAL | Status: AC | PRN
  Administered 2017-09-05: 1 via TRANSDERMAL

## 2017-09-05 MED ORDER — KETOROLAC TROMETHAMINE 30 MG/ML IJ SOLN
INTRAMUSCULAR | Status: DC | PRN
  Administered 2017-09-05: 30 mg via INTRAVENOUS

## 2017-09-05 MED ORDER — DEXAMETHASONE SODIUM PHOSPHATE 10 MG/ML IJ SOLN
INTRAMUSCULAR | Status: AC
  Filled 2017-09-05: qty 3

## 2017-09-05 MED ORDER — MIDAZOLAM HCL 2 MG/2ML IJ SOLN
INTRAMUSCULAR | Status: AC
  Filled 2017-09-05: qty 2

## 2017-09-05 MED ORDER — SUGAMMADEX SODIUM 200 MG/2ML IV SOLN
INTRAVENOUS | Status: AC
  Filled 2017-09-05: qty 2

## 2017-09-05 MED ORDER — BUPIVACAINE-EPINEPHRINE (PF) 0.5% -1:200000 IJ SOLN
INTRAMUSCULAR | Status: AC
  Filled 2017-09-05: qty 90

## 2017-09-05 MED ORDER — SUGAMMADEX SODIUM 500 MG/5ML IV SOLN
INTRAVENOUS | Status: AC
  Filled 2017-09-05: qty 5

## 2017-09-05 MED ORDER — CHLORHEXIDINE GLUCONATE CLOTH 2 % EX PADS: 6.0000 | MEDICATED_PAD | Freq: Once | CUTANEOUS | Status: DC

## 2017-09-05 MED ORDER — LIDOCAINE HCL 2 % IJ SOLN
INTRAMUSCULAR | Status: AC
Start: 2017-09-05 — End: ?
  Filled 2017-09-05: qty 40

## 2017-09-05 MED ORDER — ONDANSETRON 4 MG PO TBDP
ORAL_TABLET | ORAL | Status: AC
  Filled 2017-09-05: qty 2

## 2017-09-05 MED ORDER — LACTATED RINGERS IV SOLN
INTRAVENOUS | Status: DC
  Administered 2017-09-05 (×2): via INTRAVENOUS

## 2017-09-05 MED ORDER — LIDOCAINE 2% (20 MG/ML) 5 ML SYRINGE
INTRAMUSCULAR | Status: AC
Start: 2017-09-05 — End: ?
  Filled 2017-09-05: qty 20

## 2017-09-05 MED ORDER — HYDROMORPHONE HCL 1 MG/ML IJ SOLN
0.2500 mg | INTRAMUSCULAR | Status: DC | PRN
  Administered 2017-09-05: 0.5 mg via INTRAVENOUS
  Administered 2017-09-05: 0.25 mg via INTRAVENOUS

## 2017-09-05 MED ORDER — SODIUM CHLORIDE 0.9 % IR SOLN
Status: DC | PRN
  Administered 2017-09-05: 1000 mL

## 2017-09-05 MED ORDER — CEFAZOLIN SODIUM-DEXTROSE 2-4 GM/100ML-% IV SOLN
INTRAVENOUS | Status: AC
  Filled 2017-09-05: qty 100

## 2017-09-05 MED ORDER — LIDOCAINE HCL (CARDIAC) 20 MG/ML IV SOLN
INTRAVENOUS | Status: DC | PRN
  Administered 2017-09-05: 60 mg via INTRAVENOUS

## 2017-09-05 MED ORDER — ROCURONIUM BROMIDE 10 MG/ML (PF) SYRINGE
PREFILLED_SYRINGE | INTRAVENOUS | Status: AC
  Filled 2017-09-05: qty 5

## 2017-09-05 MED ORDER — ONDANSETRON HCL 4 MG/2ML IJ SOLN
INTRAMUSCULAR | Status: AC
  Filled 2017-09-05: qty 10

## 2017-09-05 MED ORDER — SCOPOLAMINE 1 MG/3DAYS TD PT72
MEDICATED_PATCH | TRANSDERMAL | Status: AC
  Filled 2017-09-05: qty 1

## 2017-09-05 MED ORDER — BUPIVACAINE-EPINEPHRINE (PF) 0.5% -1:200000 IJ SOLN
INTRAMUSCULAR | Status: DC | PRN
  Administered 2017-09-05: 20 mL

## 2017-09-05 MED ORDER — MEPERIDINE HCL 25 MG/ML IJ SOLN: 6.2500 mg | INTRAMUSCULAR | Status: DC | PRN

## 2017-09-05 MED ORDER — SUGAMMADEX SODIUM 200 MG/2ML IV SOLN
INTRAVENOUS | Status: DC | PRN
  Administered 2017-09-05: 200 mg via INTRAVENOUS

## 2017-09-05 MED FILL — oxyCODONE HCL 5 MG TABS: 5 | 4 days supply | Qty: 30 | Fill #0

## 2017-09-05 SURGICAL SUPPLY — 31 items
APPLIER CLIP 5 13 M/L LIGAMAX5 (MISCELLANEOUS) ×2
BLADE CLIPPER SURG (BLADE) IMPLANT
CHLORAPREP W/TINT 26ML (MISCELLANEOUS) ×2 IMPLANT
CLIP APPLIE 5 13 M/L LIGAMAX5 (MISCELLANEOUS) ×1 IMPLANT
COVER MAYO STAND STRL (DRAPES) IMPLANT
DECANTER SPIKE VIAL GLASS SM (MISCELLANEOUS) ×2 IMPLANT
DERMABOND ADVANCED (GAUZE/BANDAGES/DRESSINGS) ×1
DERMABOND ADVANCED .7 DNX12 (GAUZE/BANDAGES/DRESSINGS) ×1 IMPLANT
DRAPE C-ARM 42X72 X-RAY (DRAPES) IMPLANT
ELECT REM PT RETURN 9FT ADLT (ELECTROSURGICAL) ×2
ELECTRODE REM PT RTRN 9FT ADLT (ELECTROSURGICAL) ×1 IMPLANT
FILTER SMOKE EVAC LAPAROSHD (FILTER) IMPLANT
GLOVE SURG SIGNA 7.5 PF LTX (GLOVE) ×2 IMPLANT
GOWN STRL REUS W/ TWL LRG LVL3 (GOWN DISPOSABLE) ×3 IMPLANT
GOWN STRL REUS W/TWL LRG LVL3 (GOWN DISPOSABLE) ×3
NS IRRIG 1000ML POUR BTL (IV SOLUTION) ×2 IMPLANT
PACK BASIN DAY SURGERY FS (CUSTOM PROCEDURE TRAY) ×2 IMPLANT
POUCH SPECIMEN RETRIEVAL 10MM (ENDOMECHANICALS) ×2 IMPLANT
SCISSORS LAP 5X35 DISP (ENDOMECHANICALS) ×2 IMPLANT
SET CHOLANGIOGRAPH 5 50 .035 (SET/KITS/TRAYS/PACK) IMPLANT
SET IRRIG TUBING LAPAROSCOPIC (IRRIGATION / IRRIGATOR) ×2 IMPLANT
SLEEVE ENDOPATH XCEL 5M (ENDOMECHANICALS) ×4 IMPLANT
SLEEVE SCD COMPRESS KNEE MED (MISCELLANEOUS) ×2 IMPLANT
SPECIMEN JAR SMALL (MISCELLANEOUS) ×2 IMPLANT
SUT MON AB 4-0 PC3 18 (SUTURE) ×2 IMPLANT
TOWEL OR 17X24 6PK STRL BLUE (TOWEL DISPOSABLE) ×2 IMPLANT
TRAY LAPAROSCOPIC (CUSTOM PROCEDURE TRAY) ×2 IMPLANT
TROCAR XCEL BLUNT TIP 100MML (ENDOMECHANICALS) ×2 IMPLANT
TROCAR XCEL NON-BLD 5MMX100MML (ENDOMECHANICALS) ×2 IMPLANT
TUBE CONNECTING 20X1/4 (TUBING) ×2 IMPLANT
TUBING INSUFFLATION (TUBING) ×2 IMPLANT

## 2017-09-05 NOTE — Discharge Instructions (Signed)
[facility] ______[facility] LAPAROSCOPIC SURGERY: POST OP INSTRUCTIONS Always review your discharge instruction sheet given to you by the facility where your surgery was performed. IF YOU HAVE DISABILITY OR FAMILY LEAVE FORMS, YOU MUST BRING THEM TO THE OFFICE FOR PROCESSING.   DO NOT GIVE THEM TO YOUR DOCTOR.  1. A prescription for pain medication may be given to you upon discharge.  Take your pain medication as prescribed, if needed.  If narcotic pain medicine is not needed, then you may take acetaminophen (Tylenol) or ibuprofen (Advil) as needed. 2. Take your usually prescribed medications unless otherwise directed. 3. If you need a refill on your pain medication, please contact your pharmacy.  They will contact our office to request authorization. Prescriptions will not be filled after 5pm or on week-ends. 4. You should follow a light diet the first few days after arrival home, such as soup and crackers, etc.  Be sure to include lots of fluids daily. 5. Most patients will experience some swelling and bruising in the area of the incisions.  Ice packs will help.  Swelling and bruising can take several days to resolve.  6. It is common to experience some constipation if taking pain medication after surgery.  Increasing fluid intake and taking a stool softener (such as Colace) will usually help or prevent this problem from occurring.  A mild laxative (Milk of Magnesia or Miralax) should be taken according to package instructions if there are no bowel movements after 48 hours. 7. Unless discharge instructions indicate otherwise, you may remove your bandages 24-48 hours after surgery, and you may shower at that time.  You may have steri-strips (small skin tapes) in place directly over the incision.  These strips should be left on the skin for 7-10 days.  If your surgeon used skin glue on the incision, you may shower in 24 hours.  The glue will flake off over the next 2-3 weeks.  Any sutures or  staples will be removed at the office during your follow-up visit. 8. ACTIVITIES:  You may resume regular (light) daily activities beginning the next day--such as daily self-care, walking, climbing stairs--gradually increasing activities as tolerated.  You may have sexual intercourse when it is comfortable.  Refrain from any heavy lifting or straining until approved by your doctor. a. You may drive when you are no longer taking prescription pain medication, you can comfortably wear a seatbelt, and you can safely maneuver your car and apply brakes. b. RETURN TO WORK:  __________________________________________________________ 9. You should see your doctor in the office for a follow-up appointment approximately 2-3 weeks after your surgery.  Make sure that you call for this appointment within a day or two after you arrive home to insure a convenient appointment time. 10. OTHER INSTRUCTIONS:OK TO SHOWER STARTING TOMORROW 11. NO LIFTING MORE THAN 15 POUNDS FOR 2 WEEKS 12. ICE PACK, TYLENOL, AND IBUPROFEN ALSO FOR PAIN __________________________________________________________________________________________________________________________ __________________________________________________________________________________________________________________________ WHEN TO CALL YOUR DOCTOR: 1. Fever over 101.0 2. Inability to urinate 3. Continued bleeding from incision. 4. Increased pain, redness, or drainage from the incision. 5. Increasing abdominal pain  The clinic staff is available to answer your questions during regular business hours.  Please dont hesitate to call and ask to speak to one of the nurses for clinical concerns.  If you have a medical emergency, go to the nearest emergency room or call 911.  A surgeon from [facility] is always on call at the hospital. [address], [place],  [place], [place]  [address] ? [address], [place], [place]   [address] [phone] ?  [phone] ? FAX [phone] Web site: [email or web address]    NO IBUPROFEN UNTIL 2:00 P.M. TODAY     Post Anesthesia Home Care Instructions  Activity: Get plenty of rest for the remainder of the day. A responsible individual must stay with you for 24 hours following the procedure.  For the next 24 hours, DO NOT: -Drive a car -Paediatric nurse -Drink alcoholic beverages -Take any medication unless instructed by your physician -Make any legal decisions or sign important papers.  Meals: Start with liquid foods such as gelatin or soup. Progress to regular foods as tolerated. Avoid greasy, spicy, heavy foods. If nausea and/or vomiting occur, drink only clear liquids until the nausea and/or vomiting subsides. Call your physician if vomiting continues.  Special Instructions/Symptoms: Your throat may feel dry or sore from the anesthesia or the breathing tube placed in your throat during surgery. If this causes discomfort, gargle with warm salt water. The discomfort should disappear within 24 hours.  If you had a scopolamine patch placed behind your ear for the management of post- operative nausea and/or vomiting:  1. The medication in the patch is effective for 72 hours, after which it should be removed.  Wrap patch in a tissue and discard in the trash. Wash hands thoroughly with soap and water. 2. You may remove the patch earlier than 72 hours if you experience unpleasant side effects which may include dry mouth, dizziness or visual disturbances. 3. Avoid touching the patch. Wash your hands with soap and water after contact with the patch.

## 2017-09-05 NOTE — Op Note (Signed)
Laparoscopic Cholecystectomy Procedure Note  Indications: This patient presents with symptomatic gallbladder disease and will undergo laparoscopic cholecystectomy.  Pre-operative Diagnosis: chronic cholecystitis with biliary dyskinesia  Post-operative Diagnosis: Same  Surgeon: Coralie Keens A   Assistants: 0  Anesthesia: General endotracheal anesthesia  ASA Class: 1  Procedure Details  The patient was seen again in the Holding Room. The risks, benefits, complications, treatment options, and expected outcomes were discussed with the patient. The possibilities of reaction to medication, pulmonary aspiration, perforation of viscus, bleeding, recurrent infection, finding a normal gallbladder, the need for additional procedures, failure to diagnose a condition, the possible need to convert to an open procedure, and creating a complication requiring transfusion or operation were discussed with the patient. The likelihood of improving the patient's symptoms with return to their baseline status is good.  The patient and/or family concurred with the proposed plan, giving informed consent. The site of surgery properly noted. The patient was taken to Operating Room, identified as [facility] and the procedure verified as Laparoscopic Cholecystectomy with Intraoperative Cholangiogram. A Time Out was held and the above information confirmed.  Prior to the induction of general anesthesia, antibiotic prophylaxis was administered. General endotracheal anesthesia was then administered and tolerated well. After the induction, the abdomen was prepped with Chloraprep and draped in sterile fashion. The patient was positioned in the supine position.  Local anesthetic agent was injected into the skin near the umbilicus and an incision made. We dissected down to the abdominal fascia with blunt dissection.  The fascia was incised vertically and we entered the peritoneal cavity bluntly.  A pursestring suture of  0-Vicryl was placed around the fascial opening.  The Hasson cannula was inserted and secured with the stay suture.  Pneumoperitoneum was then created with CO2 and tolerated well without any adverse changes in the patient's vital signs. A 5-mm port was placed in the subxiphoid position.  Two 5-mm ports were placed in the right upper quadrant. All skin incisions were infiltrated with a local anesthetic agent before making the incision and placing the trocars.   We positioned the patient in reverse Trendelenburg, tilted slightly to the patient's left.  The gallbladder was chronically thick walled in appears with multiple adhesions to it. The gallbladder  fundus grasped and retracted cephalad. Adhesions were lysed bluntly and with the electrocautery where indicated, taking care not to injure any adjacent organs or viscus. The infundibulum was grasped and retracted laterally, exposing the peritoneum overlying the triangle of Calot. This was then divided and exposed in a blunt fashion. The cystic duct was clearly identified and bluntly dissected circumferentially. A critical view of the cystic duct and cystic artery was obtained.  The cystic duct was then ligated with clips and divided. The cystic artery was, dissected free, ligated with clips and divided as well.   The gallbladder was dissected from the liver bed in retrograde fashion with the electrocautery. The gallbladder was removed and placed in an Endocatch sac. The liver bed was irrigated and inspected. Hemostasis was achieved with the electrocautery. Copious irrigation was utilized and was repeatedly aspirated until clear.  The gallbladder and Endocatch sac were then removed through the umbilical port site.  The pursestring suture was used to close the umbilical fascia.    We again inspected the right upper quadrant for hemostasis.  Pneumoperitoneum was released as we removed the trocars.  4-0 Monocryl was used to close the skin.   Skin glue was then  applied. The patient was then extubated and brought  to the recovery room in stable condition. Instrument, sponge, and needle counts were correct at closure and at the conclusion of the case.   Findings: Chronic Cholecystitis without Cholelithiasis  Estimated Blood Loss: Minimal         Drains: 0         Specimens: Gallbladder           Complications: None; patient tolerated the procedure well.         Disposition: PACU - hemodynamically stable.         Condition: stable

## 2017-09-05 NOTE — Anesthesia Procedure Notes (Signed)
Procedure Name: Intubation Date/Time: 09/05/2017 7:26 AM Performed by: Signe Colt, CRNA Pre-anesthesia Checklist: Patient identified, Emergency Drugs available, Suction available and Patient being monitored Patient Re-evaluated:Patient Re-evaluated prior to induction Oxygen Delivery Method: Circle system utilized Preoxygenation: Pre-oxygenation with 100% oxygen Induction Type: IV induction Ventilation: Mask ventilation without difficulty Laryngoscope Size: Mac and 3 Grade View: Grade II Tube type: Oral Tube size: 7.0 mm Number of attempts: 1 Airway Equipment and Method: Stylet and Oral airway Placement Confirmation: ETT inserted through vocal cords under direct vision,  positive ETCO2 and breath sounds checked- equal and bilateral Tube secured with: Tape Dental Injury: Teeth and Oropharynx as per pre-operative assessment

## 2017-09-05 NOTE — Anesthesia Preprocedure Evaluation (Signed)
Anesthesia Evaluation  Patient identified by MRN, date of birth, ID band Patient awake    Reviewed: Allergy & Precautions, NPO status , Patient's Chart, lab work & pertinent test results  Airway Mallampati: II  TM Distance: >3 FB Neck ROM: Full    Dental no notable dental hx. (+) Teeth Intact   Pulmonary neg pulmonary ROS,    Pulmonary exam normal breath sounds clear to auscultation       Cardiovascular negative cardio ROS Normal cardiovascular exam Rhythm:Regular Rate:Normal     Neuro/Psych negative neurological ROS  negative psych ROS   GI/Hepatic Neg liver ROS, GERD  Medicated and Controlled,Chronic cholecystitis   Endo/Other  Obesity  Renal/GU negative Renal ROS  negative genitourinary   Musculoskeletal negative musculoskeletal ROS (+)   Abdominal (+) + obese,   Peds  Hematology negative hematology ROS (+)   Anesthesia Other Findings   Reproductive/Obstetrics                             Anesthesia Physical Anesthesia Plan  ASA: II  Anesthesia Plan: General   Post-op Pain Management:    Induction: Intravenous  PONV Risk Score and Plan: 4 or greater and Scopolamine patch - Pre-op, Midazolam, Dexamethasone, Ondansetron and Treatment may vary due to age or medical condition  Airway Management Planned: Oral ETT  Additional Equipment:   Intra-op Plan:   Post-operative Plan: Extubation in OR  Informed Consent: I have reviewed the patients History and Physical, chart, labs and discussed the procedure including the risks, benefits and alternatives for the proposed anesthesia with the patient or authorized representative who has indicated his/her understanding and acceptance.   Dental advisory given  Plan Discussed with: CRNA, Anesthesiologist and Surgeon  Anesthesia Plan Comments:         Anesthesia Quick Evaluation

## 2017-09-05 NOTE — Anesthesia Postprocedure Evaluation (Signed)
Anesthesia Post Note  Patient: Renee [name]  Procedure(s) Performed: LAPAROSCOPIC CHOLECYSTECTOMY (N/A )     Patient location during evaluation: PACU Anesthesia Type: General Level of consciousness: awake and alert and oriented Pain management: pain level controlled Vital Signs Assessment: post-procedure vital signs reviewed and stable Respiratory status: spontaneous breathing, nonlabored ventilation, respiratory function stable and patient connected to nasal cannula oxygen Cardiovascular status: blood pressure returned to baseline and stable Postop Assessment: no apparent nausea or vomiting Anesthetic complications: no    Last Vitals:  Vitals:   09/05/17 0830 09/05/17 0845  BP: 118/82 103/61  Pulse: 72 70  Resp: (!) 24 12  Temp:    SpO2: 100% 100%    Last Pain:  Vitals:   09/05/17 0839  TempSrc:   PainSc: 4                  Cashay Manganelli A.

## 2017-09-05 NOTE — Interval H&P Note (Signed)
History and Physical Interval Note:no change in H and P  09/05/2017 7:10 AM  Renee [name]  has presented today for surgery, with the diagnosis of CHRONIC CHOLECYSTITIS WITH BILIARY COLIC  The various methods of treatment have been discussed with the patient and family. After consideration of risks, benefits and other options for treatment, the patient has consented to  Procedure(s): LAPAROSCOPIC CHOLECYSTECTOMY (N/A) as a surgical intervention .  The patient's history has been reviewed, patient examined, no change in status, stable for surgery.  I have reviewed the patient's chart and labs.  Questions were answered to the patient's satisfaction.     Marquavious Nazar A

## 2017-09-05 NOTE — Transfer of Care (Signed)
Immediate Anesthesia Transfer of Care Note  Patient: Renee [name]  Procedure(s) Performed: LAPAROSCOPIC CHOLECYSTECTOMY (N/A )  Patient Location: PACU  Anesthesia Type:General  Level of Consciousness: awake and patient cooperative  Airway & Oxygen Therapy: Patient Spontanous Breathing and Patient connected to face mask oxygen  Post-op Assessment: Report given to RN and Post -op Vital signs reviewed and stable  Post vital signs: Reviewed and stable  Last Vitals:  Vitals:   09/05/17 0636  BP: 110/79  Pulse: 73  Resp: 18  Temp: 36.7 C  SpO2: 98%    Last Pain:  Vitals:   09/05/17 0636  TempSrc: Oral         Complications: No apparent anesthesia complications

## 2017-09-06 ENCOUNTER — Encounter (HOSPITAL_BASED_OUTPATIENT_CLINIC_OR_DEPARTMENT_OTHER): Payer: Self-pay | Admitting: Surgery

## 2017-09-09 MED FILL — PREDNISOLONE AC 1% EYE DROP: 1 | 25 days supply | Qty: 10 | Fill #0

## 2017-10-13 MED FILL — CIPRODEX OTIC SUSPENSION: 0.3-0.1 | 19 days supply | Qty: 8 | Fill #0

## 2017-11-10 DIAGNOSIS — H04123 Dry eye syndrome of bilateral lacrimal glands: Secondary | ICD-10-CM | POA: Diagnosis not present

## 2017-11-10 MED FILL — AMLODIPINE BESYLATE 10 MG T: 10 | 90 days supply | Qty: 90 | Fill #0

## 2017-11-10 MED FILL — AMOX-CLAV 875-125 MG TABLET: 875-125 | 10 days supply | Qty: 20 | Fill #0

## 2017-11-10 MED FILL — ONDANSETRON HCL 4 MG TABLET: 4 | 10 days supply | Qty: 30 | Fill #0

## 2017-11-11 MED FILL — predniSONE 5 MG TABS: 5 | 15 days supply | Qty: 70 | Fill #0

## 2017-11-11 MED FILL — OSELTAMIVIR PHOSPHATE 75 MG: 75 | 5 days supply | Qty: 10 | Fill #0

## 2017-11-23 DIAGNOSIS — Z6830 Body mass index (BMI) 30.0-30.9, adult: Secondary | ICD-10-CM | POA: Diagnosis not present

## 2017-11-23 DIAGNOSIS — Z01419 Encounter for gynecological examination (general) (routine) without abnormal findings: Secondary | ICD-10-CM | POA: Diagnosis not present

## 2017-11-23 DIAGNOSIS — Z1231 Encounter for screening mammogram for malignant neoplasm of breast: Secondary | ICD-10-CM | POA: Diagnosis not present

## 2017-11-23 DIAGNOSIS — Z1151 Encounter for screening for human papillomavirus (HPV): Secondary | ICD-10-CM | POA: Diagnosis not present

## 2017-11-23 LAB — HM PAP SMEAR: HM Pap smear: NEGATIVE

## 2017-12-28 ENCOUNTER — Ambulatory Visit: Payer: 59 | Admitting: Physician Assistant

## 2017-12-28 DIAGNOSIS — R1084 Generalized abdominal pain: Secondary | ICD-10-CM | POA: Diagnosis not present

## 2017-12-28 DIAGNOSIS — R109 Unspecified abdominal pain: Secondary | ICD-10-CM | POA: Diagnosis not present

## 2017-12-29 ENCOUNTER — Other Ambulatory Visit: Payer: Self-pay | Admitting: Obstetrics and Gynecology

## 2017-12-30 DIAGNOSIS — N83201 Unspecified ovarian cyst, right side: Secondary | ICD-10-CM | POA: Diagnosis not present

## 2018-01-27 DIAGNOSIS — N83209 Unspecified ovarian cyst, unspecified side: Secondary | ICD-10-CM | POA: Diagnosis not present

## 2018-01-27 DIAGNOSIS — R109 Unspecified abdominal pain: Secondary | ICD-10-CM | POA: Diagnosis not present

## 2018-01-27 MED FILL — CYCLOBENZAPRINE 10 MG TAB: 10 | 10 days supply | Qty: 30 | Fill #0

## 2018-01-31 ENCOUNTER — Telehealth: Payer: Self-pay | Admitting: Family Medicine

## 2018-01-31 ENCOUNTER — Encounter: Payer: Self-pay | Admitting: Family Medicine

## 2018-01-31 ENCOUNTER — Ambulatory Visit (INDEPENDENT_AMBULATORY_CARE_PROVIDER_SITE_OTHER): Payer: 59

## 2018-01-31 ENCOUNTER — Ambulatory Visit (INDEPENDENT_AMBULATORY_CARE_PROVIDER_SITE_OTHER): Payer: 59 | Admitting: Family Medicine

## 2018-01-31 VITALS — BP 90/64 | HR 106 | Temp 100.1°F | Ht 63.0 in | Wt 177.2 lb

## 2018-01-31 DIAGNOSIS — G8929 Other chronic pain: Secondary | ICD-10-CM

## 2018-01-31 DIAGNOSIS — M545 Low back pain, unspecified: Secondary | ICD-10-CM

## 2018-01-31 MED ORDER — PREDNISONE 20 MG PO TABS: ORAL_TABLET | ORAL | 0 refills | Status: DC

## 2018-01-31 MED ORDER — GABAPENTIN 100 MG PO CAPS: 100.0000 mg | ORAL_CAPSULE | Freq: Every day | ORAL | 3 refills | Status: DC

## 2018-01-31 MED FILL — predniSONE 20 MG TABS: 20 | 7 days supply | Qty: 10 | Fill #0

## 2018-01-31 MED FILL — GABAPENTIN 100 MG CAP: 100 | 30 days supply | Qty: 90 | Fill #0

## 2018-01-31 NOTE — Telephone Encounter (Signed)
Patient would like a referral for physical therapy

## 2018-01-31 NOTE — Progress Notes (Signed)
Please call Renee [name]- she doesn't tend to use mychart.  X-ray shows some curvature of the spine (scloilosis). No significant arthritis found. Does not rule out a bulging disc but they also are not obvious from the x-ray findings. Lets move forward with trial of prednisone

## 2018-01-31 NOTE — Telephone Encounter (Signed)
The Patient would like a referral for Physical Therapy.

## 2018-01-31 NOTE — Patient Instructions (Addendum)
I suspect this could be a bulging disc pressing on one of your nerves and causing such severe pain.   Lets try prednisone for 7 days (start tomorrow morning with this higher dose)  Lets try gabapentin 100mg  tonight. Can go up to 200mg  tomorrow night and 300mg  night after- if not helping enough with pain as long as side effects particularly of fatigue are not too much  Schedule to see Dr. Paulla Fore on the 29th or later of this month ( I placed referral so you can schedule at desk before you leave)  Make sure to stay well hydrated with your virus. Happy to see you back to reevaluate this if need be  Please stop by x-ray before you go

## 2018-01-31 NOTE — Telephone Encounter (Signed)
Pt requesting a Physical Therapy referral. Please advise.

## 2018-01-31 NOTE — Progress Notes (Signed)
Subjective:  Renee [name] is a 47 y.o. year old very pleasant female patient who presents for/with See problem oriented charting ROS- slight elevation temperature today. Some diarrhea. No objective fevers.  No saddle anesthesia, bladder incontinence, fecal incontinence, weakness in extremity (related to pain does have some), numbness or tingling in extremity. History negative for trauma, history of cancer, fever and chills (other than today), unintentional weight loss, recent bacterial infection, recent IV drug use, HIV, pain worse at night or while supine.   Past Medical History-  Patient Active Problem List   Diagnosis Date Noted  . Uveitis     Priority: High  . Family history of colon cancer 08/21/2015    Priority: Low  . GERD (gastroesophageal reflux disease)     Priority: Low  . Postpartum care following cesarean delivery (7/6) 04/23/2016  . Chorioamnionitis, delivered, current hospitalization 04/22/2016    Medications- reviewed and updated Current Outpatient Medications  Medication Sig Dispense Refill  . diclofenac (VOLTAREN) 50 MG EC tablet Take 50 mg by mouth daily.    Marland Kitchen ibuprofen (ADVIL,MOTRIN) 600 MG tablet Take by mouth.    . oxyCODONE (OXY IR/ROXICODONE) 5 MG immediate release tablet Take 1-2 tablets (5-10 mg total) every 6 (six) hours as needed by mouth for moderate pain or severe pain. 30 tablet 0  . gabapentin (NEURONTIN) 100 MG capsule Take 1-3 capsules (100-300 mg total) by mouth at bedtime. 90 capsule 3  . predniSONE (DELTASONE) 20 MG tablet Take 2 pills for 3 days, 1 pill for 4 days 10 tablet 0   No current facility-administered medications for this visit.     Objective: BP 90/64 (BP Location: Left Arm, Patient Position: Sitting, Cuff Size: Large)   Pulse (!) 106   Temp 100.1 F (37.8 C) (Oral)   Ht 5\' 3"  (1.6 m)   Wt 177 lb 3.2 oz (80.4 kg)   LMP 01/28/2018 (Exact Date)   SpO2 98%   BMI 31.39 kg/m  Gen: NAD, warm to touch, appears fatigued Slightly dry  mucus membranes CV: RRR no murmurs rubs or gallops Lungs: CTAB no crackles, wheeze, rhonchi Abdomen: soft/nontender/nondistended/normal bowel sounds. No rebound or guarding.  Ext: no edema Skin: warm, dry  Back - Normal skin, Spine with normal alignment and no deformity.  No tenderness to vertebral process palpation.  Paraspinous muscles are not tender and without spasm.   Range of motion is full at neck and lumbar sacral regions. Negative Straight leg raise. Patient does get significant back pain with flexion of thigh to 90 degrees and beyond. FABER does not produce pain Neuro- no saddle anesthesia, 5/5 strength lower extremities, 2+ reflexes  Dg Lumbar Spine Complete  Result Date: 01/31/2018 CLINICAL DATA:  Worsening low back and right flank pain over the past 2 months. No known injury. EXAM: LUMBAR SPINE - COMPLETE 4+ VIEW COMPARISON:  CT abdomen and pelvis 12/28/2017. FINDINGS: Mild convex right scoliosis with the apex at L2-3 is unchanged. No listhesis. Vertebral body height and intervertebral disc space height are maintained. No pars interarticularis defect. Facet joints are unremarkable. IMPRESSION: Mild convex right scoliosis.  Otherwise negative. Electronically Signed   By: Inge Rise M.D.   On: 01/31/2018 16:15   I also independently reviewed x-ray. I see evidence of scoliosis. No obvious fractures. Disc space appears preserved- no obvious degenerative changes.   Assessment/Plan:  Chronic right-sided low back pain without sciatica - Plan: DG Lumbar Spine Complete, Ambulatory referral to Sports Medicine S: Started 2 months ago when arrived in  [place] to ED did ultrasound and thought related to kidneys. UA was normal. Came home and saw urology- CT scan done at urology and was told not related to kidneys. Has been on nsaids and narcotics. OB GYn said pain is not from ovarian cysts- there were 2 cysts that urology referred her. No pregnancy- also IUD verified in place.   At  beginning started in right low back and radiated to the front. More recently- pain with lifting right leg within 4-5 weeks aggravates the pain . Laying down makes pain worse. Sitting makes pain worse. Standing up is probably the best position for her. On prednisone 20mg , 15 mg, 15mg  for 3 days total- they had some leftover pills.   Diarrhea yesterday and this morning with some nausea. Low grade temperature just started yesterday. Has some chills as well. No other sick contacts.  A/P: from avs . Suspect lumbar disc disease. Also with viral illness likely GI in origin noted over last day or so.  "I suspect this could be a bulging disc pressing on one of your nerves and causing such severe pain.   Lets try prednisone for 7 days (start tomorrow morning with this higher dose)  Lets try gabapentin 100mg  tonight. Can go up to 200mg  tomorrow night and 300mg  night after- if not helping enough with pain as long as side effects particularly of fatigue are not too much  Schedule to see Dr. Paulla Fore on the 29th or later of this month ( I placed referral so you can schedule at desk before you leave)  Make sure to stay well hydrated with your virus. Happy to see you back to reevaluate this if need be"  Future Appointments  Date Time Provider [place]  02/13/2018  3:40 PM Gerda Diss, DO [facility]   Lab/Order associations: Chronic right-sided low back pain without sciatica - Plan: DG Lumbar Spine Complete, Ambulatory referral to Sports Medicine  Meds ordered this encounter  Medications  . predniSONE (DELTASONE) 20 MG tablet    Sig: Take 2 pills for 3 days, 1 pill for 4 days    Dispense:  10 tablet    Refill:  0  . gabapentin (NEURONTIN) 100 MG capsule    Sig: Take 1-3 capsules (100-300 mg total) by mouth at bedtime.    Dispense:  90 capsule    Refill:  3   Return precautions advised.  Garret Reddish, MD

## 2018-02-01 ENCOUNTER — Telehealth: Payer: Self-pay | Admitting: Family Medicine

## 2018-02-01 NOTE — Telephone Encounter (Signed)
Spoke with patient and advised that she trial the prednisone first as Dr. Yong Channel advised. I explained we needed to try one therapy at a time so that if she got relief we would know what worked for her and how to best help her. She verbalized understaning

## 2018-02-01 NOTE — Telephone Encounter (Signed)
Patient is scheduled with Sports Medicine at the end of the month also. I did tell her she could address PT at that time also

## 2018-02-01 NOTE — Telephone Encounter (Signed)
Copied from [place] [id number]. Topic: Quick Communication - See Telephone Encounter >> Feb 01, 2018  9:33 AM Ether Griffins B wrote: CRM for notification. See Telephone encounter for: 02/01/18.  Pt is calling checking on results of her xray.   Pt also stating she had a fever or 102.3 last night. Pt is worried about an abscess in her back. Pt states so far today she feels ok but if she does start feeling bad she will go to emergency room and call to make Dr. Yong Channel aware.

## 2018-02-01 NOTE — Telephone Encounter (Signed)
See note

## 2018-02-01 NOTE — Telephone Encounter (Signed)
Spoke with patient who verbalized understanding of lab results 

## 2018-02-07 DIAGNOSIS — Z130 Encounter for screening for diseases of the blood and blood-forming organs and certain disorders involving the immune mechanism: Secondary | ICD-10-CM | POA: Diagnosis not present

## 2018-02-07 DIAGNOSIS — Z0000 Encounter for general adult medical examination without abnormal findings: Secondary | ICD-10-CM | POA: Diagnosis not present

## 2018-02-07 DIAGNOSIS — Z131 Encounter for screening for diabetes mellitus: Secondary | ICD-10-CM | POA: Diagnosis not present

## 2018-02-07 DIAGNOSIS — Z1329 Encounter for screening for other suspected endocrine disorder: Secondary | ICD-10-CM | POA: Diagnosis not present

## 2018-02-07 DIAGNOSIS — Z13220 Encounter for screening for lipoid disorders: Secondary | ICD-10-CM | POA: Diagnosis not present

## 2018-02-07 LAB — CBC AND DIFFERENTIAL
HCT: 39 (ref 36–46)
Hemoglobin: 12.8 (ref 12.0–16.0)
Platelets: 380 (ref 150–399)
WBC: 11.5

## 2018-02-07 LAB — HEPATIC FUNCTION PANEL
ALT: 14 (ref 7–35)
AST: 9 — AB (ref 13–35)
Alkaline Phosphatase: 46 (ref 25–125)
Bilirubin, Total: 0.2
Bilirubin, Total: 0.2

## 2018-02-07 LAB — LIPID PANEL
Cholesterol: 233 — AB (ref 0–200)
HDL: 53 (ref 35–70)
LDL Cholesterol: 145
Triglycerides: 177 — AB (ref 40–160)

## 2018-02-07 LAB — BASIC METABOLIC PANEL
BUN: 23 — AB (ref 4–21)
BUN: 23 — AB (ref 4–21)
Creatinine: 0.7 (ref <=1.1)
Glucose: 88
Glucose: 88
Potassium: 4.2 (ref 3.4–5.3)
Potassium: 4.2 (ref 3.4–5.3)
Sodium: 145 (ref 137–147)
Sodium: 145 (ref 137–147)

## 2018-02-07 LAB — HEMOGLOBIN A1C: Hemoglobin A1C: 5.4

## 2018-02-07 LAB — TSH: TSH: 2.2 (ref <=5.90)

## 2018-02-08 ENCOUNTER — Ambulatory Visit: Payer: Self-pay | Admitting: *Deleted

## 2018-02-08 NOTE — Telephone Encounter (Signed)
Pt reports symptoms of back pain that is worse on movement. Pt recently completed course of Prednisone. Denies  Any neurological symptoms. Appointment made with Dr Yong Channel for Friday   Reason for Disposition . [1] MODERATE back pain (e.g., interferes with normal activities) AND [2] present > 3 days  Answer Assessment - Initial Assessment Questions 1. ONSET: "When did the pain begin?"       Feb 21    2. LOCATION: "Where does it hurt?" (upper, mid or lower back)       Lower r  Side  Back pain   3. SEVERITY: "How bad is the pain?"  (e.g., Scale 1-10; mild, moderate, or severe)   - MILD (1-3): doesn't interfere with normal activities    - MODERATE (4-7): interferes with normal activities or awakens from sleep    - SEVERE (8-10): excruciating pain, unable to do any normal activities        Moderate when sitting  Severe  On activity   4. PATTERN: "Is the pain constant?" (e.g., yes, no; constant, intermittent)         Worse  On movement   5. RADIATION: "Does the pain shoot into your legs or elsewhere?"       None  6. CAUSE:  "What do you think is causing the back pain?"         Recently  Seen  By  Dr  Yong Channel   -   Pt has  Possible  Bulging  Disc  7. BACK OVERUSE:  "Any recent lifting of heavy objects, strenuous work or exercise?"       No  8. MEDICATIONS: "What have you taken so far for the pain?" (e.g., nothing, acetaminophen, NSAIDS)     Just  Finished Prednisone  And  Still taking gabapentin  And Flexeril   9. NEUROLOGIC SYMPTOMS: "Do you have any weakness, numbness, or problems with bowel/bladder control?"     No  10. OTHER SYMPTOMS: "Do you have any other symptoms?" (e.g., fever, abdominal pain, burning with urination, blood in urine)      NO  11. PREGNANCY: "Is there any chance you are pregnant?" (e.g., yes, no; LMP)  IUD  Protocols used: BACK PAIN-A-AH

## 2018-02-10 ENCOUNTER — Ambulatory Visit: Payer: 59 | Admitting: Family Medicine

## 2018-02-13 ENCOUNTER — Ambulatory Visit (INDEPENDENT_AMBULATORY_CARE_PROVIDER_SITE_OTHER): Payer: 59 | Admitting: Sports Medicine

## 2018-02-13 ENCOUNTER — Encounter: Payer: Self-pay | Admitting: Sports Medicine

## 2018-02-13 VITALS — BP 102/76 | HR 87 | Ht 63.0 in | Wt 174.4 lb

## 2018-02-13 DIAGNOSIS — M9902 Segmental and somatic dysfunction of thoracic region: Secondary | ICD-10-CM | POA: Diagnosis not present

## 2018-02-13 DIAGNOSIS — M9905 Segmental and somatic dysfunction of pelvic region: Secondary | ICD-10-CM | POA: Diagnosis not present

## 2018-02-13 DIAGNOSIS — M9903 Segmental and somatic dysfunction of lumbar region: Secondary | ICD-10-CM | POA: Diagnosis not present

## 2018-02-13 DIAGNOSIS — M545 Low back pain, unspecified: Secondary | ICD-10-CM

## 2018-02-13 DIAGNOSIS — G8929 Other chronic pain: Secondary | ICD-10-CM

## 2018-02-13 MED ORDER — IBUPROFEN-FAMOTIDINE 800-26.6 MG PO TABS: 1.0000 | ORAL_TABLET | Freq: Three times a day (TID) | ORAL | 0 refills | Status: AC | PRN

## 2018-02-13 MED ORDER — IBUPROFEN-FAMOTIDINE 800-26.6 MG PO TABS: 1.0000 | ORAL_TABLET | Freq: Three times a day (TID) | ORAL | 2 refills | Status: DC | PRN

## 2018-02-13 NOTE — Progress Notes (Signed)
Renee [name]. Renee [name], Renee [name] at [place]  Renee [name] - 47 y.o. female MRN [id number]  Date of birth: December 10, 1970  Visit Date: 02/13/2018  PCP: Marin Olp, MD   Referred by: Marin Olp, MD  Scribe for today's visit: Josepha Pigg, CMA     SUBJECTIVE:  Renee [name] is here for Initial Assessment (back pain) .  Referred by: Dr. Garret Reddish Her back pain symptoms INITIALLY: Began feb 2019 and pain started after traveling to [place].  Described as severe stabbing, non-radiating Worsened with sitting or lying down, turning steering wheel.  Improved with standing.  Additional associated symptoms include: She saw urology and had a CT which showed that kidneys were normal. She saw OBGYN and was told that she had ovarian cyst. Denies loss of control of bladder or bowel functions.     At this time symptoms show no change compared to onset.Some days are worse than others.  She was prescribed Prednisone and got about 35-40% relief. She has been taking Gabapentin because she was told that she may have a bulging disc affecting a nerve in her back. She takes IBU and get some relief but it causes reflux issues.She has also tried Oxycodone but didn't get any relief with that.   XR L-spine 01/31/18.   ROS Reports night time disturbances. Denies fevers, chills, or night sweats. Denies unexplained weight loss. Denies personal history of cancer. Denies changes in bowel or bladder habits. Denies recent unreported falls. Denies new or worsening dyspnea or wheezing. Denies headaches or dizziness.  Reports n/t in R leg over the past 3 days.  Denies dizziness or presyncopal episodes Denies lower extremity edema    HISTORY & PERTINENT PRIOR DATA:  Prior History reviewed and updated per electronic medical record.  Significant/pertinent history, findings, studies include:  reports that she has never smoked. She has never used  smokeless tobacco. No results for input(s): HGBA1C, LABURIC, CREATINE in the last 8760 hours. No specialty comments available. No problems updated.  OBJECTIVE:  VS:  HT:5\' 3"  (160 cm)   WT:174 lb 6.4 oz (79.1 kg)  BMI:30.9    BP:102/76  HR:87bpm  TEMP: ( )  RESP:98 %   PHYSICAL EXAM: Constitutional: WDWN, Non-toxic appearing. Psychiatric: Alert & appropriately interactive.  Not depressed or anxious appearing. Respiratory: No increased work of breathing.  Trachea Midline Eyes: Pupils are equal.  EOM intact without nystagmus.  No scleral icterus  Vascular Exam: warm to touch no edema  lower extremity neuro exam: unremarkable normal strength normal sensation  MSK Exam: Negative straight leg raise bilaterally.  She has marked hip tightness.  She has normal heel toe walk.  Lower extremity sensation and gait is within normal limits.  Bilateral paraspinal muscle spasms.   ASSESSMENT & PLAN:   1. Chronic right-sided low back pain without sciatica   2. Somatic dysfunction of thoracic region   3. Somatic dysfunction of lumbar region   4. Somatic dysfunction of pelvis region     PLAN: Osteopathic manipulation was performed today based on physical exam findings.  Please see procedure note for further information including Osteopathic Exam findings Discussed the foundation of treatment for this condition is physical therapy and/or daily (5-6 days/week) therapeutic exercises, focusing on core strengthening, coordination, neuromuscular control/reeducation.  Therapeutic exercises prescribed per procedure note.  Links to [employer] provided today per Patient Instructions.  These exercises were developed by Minerva Ends, DC with a strong emphasis on  core neuromuscular reducation and postural realignment through body-weight exercises.  Short course of anti-inflammatories with Duexis.  Follow-up: Return in about 2 weeks (around 02/27/2018).      Please see additional  documentation for Objective, Assessment and Plan sections. Pertinent additional documentation may be included in corresponding procedure notes, imaging studies, problem based documentation and patient instructions. Please see these sections of the encounter for additional information regarding this visit.  CMA/ATC served as Education administrator during this visit. History, Physical, and Plan performed by medical provider. Documentation and orders reviewed and attested to.      Gerda Diss, [facility] Physician

## 2018-02-13 NOTE — Progress Notes (Signed)
PROCEDURE NOTE: THERAPEUTIC EXERCISES (97110) 15 minutes spent for Therapeutic exercises as below and as referenced in the AVS.  This included exercises focusing on stretching, strengthening, with significant focus on eccentric aspects.   Proper technique shown and discussed handout in great detail with ATC.  All questions were discussed and answered.   Long term goals include an improvement in range of motion, strength, endurance as well as avoiding reinjury. Frequency of visits is one time as determined during today's  office visit. Frequency of exercises to be performed is as per handout.  EXERCISES REVIEWED: Thoracic Mobility Goodman Exercises Cat and Camel

## 2018-02-13 NOTE — Progress Notes (Signed)
PROCEDURE NOTE : OSTEOPATHIC MANIPULATION The decision today to treat with Osteopathic Manipulative Therapy (OMT) was based on physical exam findings. Verbal consent was obtained following a discussion with the patient regarding the of risks, benefits and potential side effects, including an acute pain flare,post manipulation soreness and need for repeat treatments.     Contraindications to OMT reviewed and include: NONE  Manipulation was performed as below: Regions treated: Thoracic spine, Lumbar spine and Pelvis OMT Techniques Used: HVLA, muscle energy and myofascial release  The patient tolerated the treatment well and reported Improved symptoms following treatment today. Patient was given medications, exercises, stretches and lifestyle modifications per AVS and verbally.   OSTEOPATHIC/STRUCTURAL EXAM:   T6 FRS right (Flexed, Rotated & Sidebent) L3 FRS right (Flexed, Rotated & Sidebent) Right psoas spasm Right anterior innonimate

## 2018-02-13 NOTE — Patient Instructions (Addendum)
Also check out [employer]" which is a program developed by Dr. Minerva Ends.   There are links to a couple of his YouTube Videos below and I would like to see performing one of his videos 5-6 days per week.    A good intro video is: "Independence from Pain 7-minute Video" - [email or web address]   His more advanced video is: "Powerful Posture and Pain Relief: 12 minutes of Foundation Training" - https://youtu.be/[email or web address]  Do not try to attempt this entire video when first beginning.    Try breaking of each exercise that he goes into shorter segments.  Otherwise if they perform an exercise for 45 seconds, start with 15 seconds and rest and then resume when they begin the new activity.    If you work your way up to doing this 12 minute video, I expect you will see significant improvements in your pain.  If you enjoy his videos and would like to find out more you can look on his website: [email or web address]  He has a workout streaming option as well as a DVD set available for purchase.  Amazon has the best price for his DVDs.    Please perform the exercise program that we have prepared for you and gone over in detail on a daily basis.  In addition to the handout you were provided you can access your program through: [email or web address]   Your unique program code is:  [email or web address]

## 2018-02-27 ENCOUNTER — Ambulatory Visit (INDEPENDENT_AMBULATORY_CARE_PROVIDER_SITE_OTHER): Payer: 59 | Admitting: Sports Medicine

## 2018-02-27 ENCOUNTER — Encounter: Payer: Self-pay | Admitting: Sports Medicine

## 2018-02-27 VITALS — BP 104/70 | HR 83 | Ht 63.0 in | Wt 176.6 lb

## 2018-02-27 DIAGNOSIS — G8929 Other chronic pain: Secondary | ICD-10-CM | POA: Diagnosis not present

## 2018-02-27 DIAGNOSIS — M9901 Segmental and somatic dysfunction of cervical region: Secondary | ICD-10-CM

## 2018-02-27 DIAGNOSIS — M545 Low back pain, unspecified: Secondary | ICD-10-CM

## 2018-02-27 DIAGNOSIS — R42 Dizziness and giddiness: Secondary | ICD-10-CM | POA: Diagnosis not present

## 2018-02-27 DIAGNOSIS — M9908 Segmental and somatic dysfunction of rib cage: Secondary | ICD-10-CM | POA: Diagnosis not present

## 2018-02-27 DIAGNOSIS — M9902 Segmental and somatic dysfunction of thoracic region: Secondary | ICD-10-CM

## 2018-02-27 DIAGNOSIS — M9905 Segmental and somatic dysfunction of pelvic region: Secondary | ICD-10-CM

## 2018-02-27 DIAGNOSIS — M9903 Segmental and somatic dysfunction of lumbar region: Secondary | ICD-10-CM | POA: Diagnosis not present

## 2018-02-27 DIAGNOSIS — H8110 Benign paroxysmal vertigo, unspecified ear: Secondary | ICD-10-CM | POA: Diagnosis not present

## 2018-02-27 NOTE — Progress Notes (Signed)
PROCEDURE NOTE : OSTEOPATHIC MANIPULATION The decision today to treat with Osteopathic Manipulative Therapy (OMT) was based on physical exam findings. Verbal consent was obtained following a discussion with the patient regarding the of risks, benefits and potential side effects, including an acute pain flare,post manipulation soreness and need for repeat treatments.     Contraindications to OMT reviewed and include: NONE  Manipulation was performed as below: Regions treated: Cervical spine, Ribs, Thoracic spine, Lumbar spine, Pelvis and Sacrum OMT Techniques Used: HVLA, muscle energy and myofascial release  The patient tolerated the treatment well and reported Improved symptoms following treatment today. Patient was given medications, exercises, stretches and lifestyle modifications per AVS and verbally.   OSTEOPATHIC/STRUCTURAL EXAM:   C2 through C4 FRS left C5 FRS right T2 extended side bent right T4 through T6 rotated left L3 FRS right Left on left sacral torsion Right anterior innominate

## 2018-02-27 NOTE — Progress Notes (Signed)
Renee [name]. [place], [place] at [place]  Renee [name] - 47 y.o. female MRN [id number]  Date of birth: 08-25-1971  Visit Date: 02/27/2018  PCP: Marin Olp, MD   Referred by: Marin Olp, MD  Scribe for today's visit: Renee [name], CMA     SUBJECTIVE:  Renee [name] is here for Follow-up (Chronic right-sided low back pain without sciatica)  02/13/2018: Her back pain symptoms INITIALLY: Began feb 2019 and pain started after traveling to [place].  Described as severe stabbing, non-radiating Worsened with sitting or lying down, turning steering wheel.  Improved with standing.  Additional associated symptoms include: She saw urology and had a CT which showed that kidneys were normal. She saw OBGYN and was told that she had ovarian cyst. Denies loss of control of bladder or bowel functions.     At this time symptoms show no change compared to onset.Some days are worse than others.  She was prescribed Prednisone and got about 35-40% relief. She has been taking Gabapentin because she was told that she may have a bulging disc affecting a nerve in her back. She takes IBU and get some relief but it causes reflux issues.She has also tried Oxycodone but didn't get any relief with that.   XR L-spine 01/31/18.   02/27/2018: Compared to the last office visit on 02/13/18, her previously described back pain symptoms are improving w/ pt noting 75% improvement.  Her biggest issue is w/ sleeping, particularly w/ attempt at rotation. Current symptoms are moderate & are nonradiating She has been taking Duexis prn.  She has stopped taking the Gabapentin.  She had OMT at her last visit and was given Archie Balboa exercises.  She has been doing her HEP and [employer] on avg 5x/week between the two.  ROS Reports night time disturbances. Reports fevers, chills, or night sweats. yest to night sweats that she feel started when she started the  prednisone. Denies unexplained weight loss. Denies personal history of cancer. Reports changes in bowel or bladder habits.  Constipated x 3 weeks Denies recent unreported falls. Denies new or worsening dyspnea or wheezing. Reports headaches or dizziness. Yes to dizziness Denies numbness, tingling or weakness  In the extremities.  Reports dizziness or presyncopal episodes.  Yes to dizziness Denies lower extremity edema    HISTORY & PERTINENT PRIOR DATA:  Prior History reviewed and updated per electronic medical record.  Significant/pertinent history, findings, studies include:  reports that she has never smoked. She has never used smokeless tobacco. No results for input(s): HGBA1C, LABURIC, CREATINE in the last 8760 hours. No specialty comments available. No problems updated.  OBJECTIVE:  VS:  HT:5\' 3"  (160 cm)   WT:176 lb 9.6 oz (80.1 kg)  BMI:31.29    BP:104/70  HR:83bpm  TEMP: ( )  RESP:97 %   PHYSICAL EXAM: Constitutional: WDWN, Non-toxic appearing. Psychiatric: Alert & appropriately interactive.  Not depressed or anxious appearing. Respiratory: No increased work of breathing.  Trachea Midline Eyes: Pupils are equal.  EOM intact without nystagmus.  No scleral icterus  Vascular Exam: warm to touch no edema  upper and lower extremity neuro exam: unremarkable  MSK Exam: Low back is overall slightly tender to palpation on the right paraspinal muscle group this is minimal.  Otherwise her hips move well and she has only minimal restrictions.   ASSESSMENT & PLAN:   1. Chronic right-sided low back pain without sciatica   2. BPPV (benign paroxysmal positional  vertigo), unspecified laterality   3. Somatic dysfunction of thoracic region   4. Somatic dysfunction of lumbar region   5. Somatic dysfunction of pelvis region   6. Severe vertigo   7. Somatic dysfunction of rib cage region   8. Somatic dysfunction of cervical region     PLAN: Osteopathic manipulation was  performed today based on physical exam findings.  Please see procedure note for further information including Osteopathic Exam findings  Discussed multiple options including formal physical therapy referral but overall given the progression we will continue with current treatment plan.  Like to see her try to wean off the ibuprofen as tolerated and if any worsening features can consider further diagnostic evaluation but given the almost complete resolution of her symptoms completely and she should do well with continued therapy.  Any persistent ongoing problems she will follow-up sooner but otherwise we will see her back in 8 weeks for repeat exam.  Follow-up: Return in about 8 weeks (around 04/24/2018).     Please see additional documentation for Objective, Assessment and Plan sections. Pertinent additional documentation may be included in corresponding procedure notes, imaging studies, problem based documentation and patient instructions. Please see these sections of the encounter for additional information regarding this visit.  CMA/ATC served as Education administrator during this visit. History, Physical, and Plan performed by medical provider. Documentation and orders reviewed and attested to.      Gerda Diss, [facility] Physician

## 2018-03-04 ENCOUNTER — Encounter: Payer: Self-pay | Admitting: Sports Medicine

## 2018-04-24 ENCOUNTER — Ambulatory Visit: Payer: 59 | Admitting: Sports Medicine

## 2018-04-24 ENCOUNTER — Encounter: Payer: Self-pay | Admitting: Sports Medicine

## 2018-05-01 DIAGNOSIS — R109 Unspecified abdominal pain: Secondary | ICD-10-CM | POA: Diagnosis not present

## 2018-05-01 DIAGNOSIS — Z30431 Encounter for routine checking of intrauterine contraceptive device: Secondary | ICD-10-CM | POA: Diagnosis not present

## 2018-05-15 DIAGNOSIS — H2000 Unspecified acute and subacute iridocyclitis: Secondary | ICD-10-CM | POA: Diagnosis not present

## 2018-05-16 DIAGNOSIS — Z800 Family history of malignant neoplasm of digestive organs: Secondary | ICD-10-CM | POA: Diagnosis not present

## 2018-05-16 DIAGNOSIS — K5900 Constipation, unspecified: Secondary | ICD-10-CM | POA: Diagnosis not present

## 2018-05-16 DIAGNOSIS — Z1211 Encounter for screening for malignant neoplasm of colon: Secondary | ICD-10-CM | POA: Diagnosis not present

## 2018-05-24 ENCOUNTER — Ambulatory Visit (INDEPENDENT_AMBULATORY_CARE_PROVIDER_SITE_OTHER): Payer: 59 | Admitting: Sports Medicine

## 2018-05-24 ENCOUNTER — Encounter: Payer: Self-pay | Admitting: Sports Medicine

## 2018-05-24 VITALS — BP 100/68 | HR 79 | Ht 63.0 in | Wt 168.8 lb

## 2018-05-24 DIAGNOSIS — G8929 Other chronic pain: Secondary | ICD-10-CM

## 2018-05-24 DIAGNOSIS — M545 Low back pain, unspecified: Secondary | ICD-10-CM

## 2018-05-24 NOTE — Progress Notes (Signed)
Renee [name]. Renee [name], [employer] at [facility] [phone]  Renee [name] - 47 y.o. female MRN [id number]  Date of birth: [birth year]  Visit Date: 05/24/2018  PCP: Renee Olp, MD   Referred by: Renee Olp, MD  Scribe(s) for today's visit: Renee [name], LAT, ATC  SUBJECTIVE:  Renee [name] is here for Follow-up (R sided LBP) .    02/13/2018: Her back pain symptoms INITIALLY: Began feb 2019 and pain started after traveling to [place].  Described as severe stabbing, non-radiating Worsened with sitting or lying down, turning steering wheel.  Improved with standing.  Additional associated symptoms include: She saw urology and had a CT which showed that kidneys were normal. She saw OBGYN and was told that she had ovarian cyst. Denies loss of control of bladder or bowel functions.     At this time symptoms show no change compared to onset.Some days are worse than others.  She was prescribed Prednisone and got about 35-40% relief. She has been taking Gabapentin because she was told that she may have a bulging disc affecting a nerve in her back. She takes IBU and get some relief but it causes reflux issues.She has also tried Oxycodone but didn't get any relief with that.   XR L-spine 01/31/18.   02/27/2018: Compared to the last office visit on 02/13/18, her previously described back pain symptoms are improving w/ pt noting 75% improvement.  Her biggest issue is w/ sleeping, particularly w/ attempt at rotation. Current symptoms are moderate & are nonradiating She has been taking Duexis prn.  She has stopped taking the Gabapentin.  She had OMT at her last visit and was given Renee [name] exercises.  She has been doing her HEP and [employer] on avg 5x/week between the two.  05/24/2018: Compared to the last office visit on 02/27/18, her previously described R-sided LBP symptoms are improving w/ no current c/o. Current symptoms are non-existent & are  nonradiating She has stopped taking the Duexis.  She has been doing her HEP and [employer] a total of 5 days/week between the 2 of them.  She has resumed working out and has no c/o pain.  L-spine XR - 01/31/18   REVIEW OF SYSTEMS: Denies night time disturbances. Denies fevers, chills, or night sweats. Denies unexplained weight loss. Denies personal history of cancer. Denies changes in bowel or bladder habits. Denies recent unreported falls. Denies new or worsening dyspnea or wheezing. Denies headaches or dizziness.  Denies numbness, tingling or weakness  In the extremities.  Denies dizziness or presyncopal episodes Denies lower extremity edema    HISTORY:  Prior history reviewed and updated per electronic medical record.  Social History   Occupational History  . Not on file  Tobacco Use  . Smoking status: Never Smoker  . Smokeless tobacco: Never Used  Substance and Sexual Activity  . Alcohol use: No  . Drug use: No  . Sexual activity: Yes    Birth control/protection: IUD   Social History   Social History Narrative   Married. 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 Goes for walks with kids. Enjoys shopping/spending money.            DATA OBTAINED & REVIEWED:  No results for  input(s): HGBA1C, LABURIC, CREATINE in the last 8760 hours. . 01/31/2018 x-rays lumbar spine: Slight dextroscoliosis but otherwise unremarkable. .   OBJECTIVE:  VS:  HT:5\' 3"  (160 cm)   WT:168 lb 12.8 oz (76.6 kg)  BMI:29.91    BP:100/68  HR:79bpm  TEMP: ( )  RESP:97 %   PHYSICAL EXAM: CONSTITUTIONAL: Well-developed, Well-nourished and In no acute distress PSYCHIATRIC: Alert & appropriately interactive. and Not depressed or anxious appearing. RESPIRATORY: No increased work of breathing and Trachea Midline EYES: Pupils  are equal., EOM intact without nystagmus. and No scleral icterus.  VASCULAR EXAM: Warm and well perfused NEURO: unremarkable  MSK Exam: Spine is overall in a more neutral position with improved posture.  She is able heel toe walk without difficulty.  No significant tenderness to palpation.  Overall significantly improved   ASSESSMENT   1. Chronic right-sided low back pain without sciatica     PLAN:  Pertinent additional documentation may be included in corresponding procedure notes, imaging studies, problem based documentation and patient instructions.  Procedures:  . Discussed the foundation of treatment for this condition is physical therapy and/or daily (5-6 days/week) therapeutic exercises, focusing on core strengthening, coordination, neuromuscular control/reeducation.  Therapeutic exercises prescribed per procedure note.   Medications:  No orders of the defined types were placed in this encounter.  Discussion/Instructions: No problem-specific Assessment & Plan notes found for this encounter.  . Doing well, being adherent to the home exercise program.  We will advance her program today. . Continue previously prescribed home exercise program.  . Discussed red flag symptoms that warrant earlier emergent evaluation and patient voices understanding. . Activity modifications and the importance of avoiding exacerbating activities (limiting pain to no more than a 4 / 10 during or following activity) recommended and discussed.   Follow-up:  . Return if symptoms worsen or fail to improve.   . If any lack of improvement consider: referral to Physical Therapy  .      CMA/ATC served as Education administrator during this visit. History, Physical, and Plan performed by medical provider. Documentation and orders reviewed and attested to.      Gerda Diss, [facility] Physician

## 2018-05-24 NOTE — Patient Instructions (Addendum)
Also check out [employer]" which is a program developed by Dr. Minerva Ends.   There are links to a couple of his YouTube Videos below and I would like to see performing one of his videos 5-6 days per week.    A good intro video is: "Independence from Pain 7-minute Video" - [email or web address]   Exercises that focus more on the neck are as below: Dr. Archie Balboa with [facility] teaching neck and shoulder details Part 1 - https://youtu.be/[email or web address] Part 2 Dr. Archie Balboa with [facility] quick routine to practice daily - https://[email or web address].be/[email or web address]  Do not try to attempt the entire video when first beginning.    Try breaking of each exercise that he goes into shorter segments.  Otherwise if they perform an exercise for 45 seconds, start with 15 seconds and rest and then resume when they begin the new activity.  If you work your way up to being able to do these videos without having to stop, I expect you will see significant improvements in your pain.  If you enjoy his videos and would like to find out more you can look on his website: [email or web address]  He has a workout streaming option as well as a DVD set available for purchase.  Amazon has the best price for his DVDs.     Please perform the exercise program that we have prepared for you and gone over in detail on a daily basis.  In addition to the handout you were provided you can access your program through: [email or web address]   Your unique program code is: [email or web address]

## 2018-06-06 MED FILL — GAVILYTE-G SOLUTION: 236 | 1 days supply | Qty: 4000 | Fill #0

## 2018-06-07 DIAGNOSIS — Z800 Family history of malignant neoplasm of digestive organs: Secondary | ICD-10-CM | POA: Diagnosis not present

## 2018-06-07 DIAGNOSIS — Z1211 Encounter for screening for malignant neoplasm of colon: Secondary | ICD-10-CM | POA: Diagnosis not present

## 2018-06-07 LAB — HM COLONOSCOPY

## 2018-06-09 ENCOUNTER — Other Ambulatory Visit: Payer: Self-pay

## 2018-06-09 ENCOUNTER — Encounter: Payer: Self-pay | Admitting: Family Medicine

## 2018-07-04 ENCOUNTER — Encounter: Payer: Self-pay | Admitting: Sports Medicine

## 2018-07-04 NOTE — Progress Notes (Signed)
PROCEDURE NOTE: THERAPEUTIC EXERCISES (97110) 15 minutes spent for Therapeutic exercises as below and as referenced in the AVS.  This included exercises focusing on stretching, strengthening, with significant focus on eccentric aspects.   Proper technique shown and discussed handout in great detail with ATC.  All questions were discussed and answered.   Long term goals include an improvement in range of motion, strength, endurance as well as avoiding reinjury. Frequency of visits is one time as determined during today's  office visit. Frequency of exercises to be performed is as per handout.  EXERCISES REVIEWED:  Renee [name] Exercises  Core stabilization exercises, dynamic stability.

## 2018-07-20 MED FILL — levoFLOXacin 500 MG TABS: 500 | 7 days supply | Qty: 7 | Fill #0

## 2018-08-10 ENCOUNTER — Encounter (HOSPITAL_COMMUNITY): Payer: Self-pay

## 2018-08-10 ENCOUNTER — Ambulatory Visit (HOSPITAL_COMMUNITY)
Admission: RE | Admit: 2018-08-10 | Discharge: 2018-08-10 | Disposition: A | Discharge status: Home / Self Care | Payer: 59 | Source: Ambulatory Visit | Attending: Gastroenterology | Admitting: Gastroenterology

## 2018-08-10 ENCOUNTER — Other Ambulatory Visit: Payer: Self-pay | Admitting: Gastroenterology

## 2018-08-10 DIAGNOSIS — R110 Nausea: Secondary | ICD-10-CM | POA: Diagnosis not present

## 2018-08-10 DIAGNOSIS — R1032 Left lower quadrant pain: Secondary | ICD-10-CM | POA: Diagnosis not present

## 2018-08-10 DIAGNOSIS — R109 Unspecified abdominal pain: Secondary | ICD-10-CM | POA: Diagnosis not present

## 2018-08-10 DIAGNOSIS — Z800 Family history of malignant neoplasm of digestive organs: Secondary | ICD-10-CM | POA: Diagnosis not present

## 2018-08-10 DIAGNOSIS — K5730 Diverticulosis of large intestine without perforation or abscess without bleeding: Secondary | ICD-10-CM | POA: Diagnosis not present

## 2018-08-10 DIAGNOSIS — K5900 Constipation, unspecified: Secondary | ICD-10-CM | POA: Diagnosis not present

## 2018-08-10 MED ORDER — IOHEXOL 300 MG/ML  SOLN
100.0000 mL | Freq: Once | INTRAMUSCULAR | Status: AC | PRN
  Administered 2018-08-10: 100 mL via INTRAVENOUS

## 2018-08-10 MED ORDER — SODIUM CHLORIDE 0.9 % IJ SOLN
INTRAMUSCULAR | Status: AC
  Filled 2018-08-10: qty 50

## 2018-08-11 LAB — HEPATIC FUNCTION PANEL
ALT: 17 (ref 7–35)
AST: 18 (ref 13–35)
Alkaline Phosphatase: 37 (ref 25–125)
Bilirubin, Direct: 0.11 (ref 0.01–0.4)
Bilirubin, Total: 0.4

## 2018-08-11 LAB — LIPID PANEL
Cholesterol: 198 (ref 0–200)
HDL: 62 (ref 35–70)
LDL Cholesterol: 123
Triglycerides: 66 (ref 40–160)

## 2018-08-11 LAB — CBC AND DIFFERENTIAL
HCT: 38 (ref 36–46)
Hemoglobin: 12.2 (ref 12.0–16.0)
Platelets: 254 (ref 150–399)
WBC: 5.9

## 2018-08-11 LAB — BASIC METABOLIC PANEL
BUN: 17 (ref 4–21)
Creatinine: 0.7 (ref 0.5–1.1)
Glucose: 91
Potassium: 4.3 (ref 3.4–5.3)
Sodium: 141 (ref 137–147)

## 2018-08-11 LAB — TSH: TSH: 2.32 (ref 0.41–5.90)

## 2018-08-14 DIAGNOSIS — Z118 Encounter for screening for other infectious and parasitic diseases: Secondary | ICD-10-CM | POA: Diagnosis not present

## 2018-08-14 DIAGNOSIS — R102 Pelvic and perineal pain: Secondary | ICD-10-CM | POA: Diagnosis not present

## 2018-08-15 ENCOUNTER — Encounter: Payer: Self-pay | Admitting: Family Medicine

## 2018-09-11 DIAGNOSIS — D225 Melanocytic nevi of trunk: Secondary | ICD-10-CM | POA: Diagnosis not present

## 2018-09-11 DIAGNOSIS — B351 Tinea unguium: Secondary | ICD-10-CM | POA: Diagnosis not present

## 2018-09-11 MED FILL — CICLOPIROX 0.77 % GEL: 0.77 | 30 days supply | Qty: 45 | Fill #0

## 2018-09-25 DIAGNOSIS — L292 Pruritus vulvae: Secondary | ICD-10-CM | POA: Diagnosis not present

## 2018-09-25 DIAGNOSIS — N83209 Unspecified ovarian cyst, unspecified side: Secondary | ICD-10-CM | POA: Diagnosis not present

## 2018-09-25 DIAGNOSIS — N644 Mastodynia: Secondary | ICD-10-CM | POA: Diagnosis not present

## 2018-10-09 DIAGNOSIS — L293 Anogenital pruritus, unspecified: Secondary | ICD-10-CM | POA: Diagnosis not present

## 2018-10-12 DIAGNOSIS — R1032 Left lower quadrant pain: Secondary | ICD-10-CM | POA: Diagnosis not present

## 2018-10-12 LAB — HM MAMMOGRAPHY

## 2018-10-17 DIAGNOSIS — H2000 Unspecified acute and subacute iridocyclitis: Secondary | ICD-10-CM | POA: Diagnosis not present

## 2018-11-10 ENCOUNTER — Telehealth: Payer: Self-pay | Admitting: Family Medicine

## 2018-11-10 NOTE — Telephone Encounter (Signed)
Please advise if pt can be worked in for [employer].   Copied from [place]. Topic: Appointment Scheduling - Scheduling Inquiry for Clinic >> Nov 10, 2018  9:51 AM Sheran Luz wrote: Reason for CRM: Patient is requesting, if possible, to be worked in with Dr. Yong Channel for her annual physical. Patient states that she has to have it and forms completed for school  by february 24th. Please advise.

## 2018-11-14 NOTE — Telephone Encounter (Signed)
Patient is calling to state she is needing a call back in regards  to a update on getting schedule for a CPE. Before the 12-11-2018. Please advise

## 2018-11-27 ENCOUNTER — Telehealth: Payer: Self-pay | Admitting: Family Medicine

## 2018-11-27 NOTE — Telephone Encounter (Signed)
I was unable to document on the 11-10-2018 note that was already placed. I received a call form the [facility] that the patient had documents they needed filled out and told them that they could schedule the patient for the 11:20 tomorrow. I did not realize it was for an actual physical. I blocked the 3pm and 4:20pm slot to allow more time due to the additional physical being scheduled. Please let me know if you need the 3pm or 4:20pm removed from being blocked.   Thank you

## 2018-11-27 NOTE — Telephone Encounter (Signed)
Please advise if pt can be worked in earlier

## 2018-11-27 NOTE — Telephone Encounter (Signed)
noted 

## 2018-11-28 ENCOUNTER — Encounter: Payer: Self-pay | Admitting: Family Medicine

## 2018-11-28 ENCOUNTER — Ambulatory Visit (INDEPENDENT_AMBULATORY_CARE_PROVIDER_SITE_OTHER): Payer: 59 | Admitting: Family Medicine

## 2018-11-28 VITALS — BP 108/68 | HR 78 | Temp 98.3°F | Resp 18 | Ht 62.0 in | Wt 159.4 lb

## 2018-11-28 DIAGNOSIS — Z23 Encounter for immunization: Secondary | ICD-10-CM | POA: Diagnosis not present

## 2018-11-28 DIAGNOSIS — Z6829 Body mass index (BMI) 29.0-29.9, adult: Secondary | ICD-10-CM | POA: Diagnosis not present

## 2018-11-28 DIAGNOSIS — R7611 Nonspecific reaction to tuberculin skin test without active tuberculosis: Secondary | ICD-10-CM

## 2018-11-28 DIAGNOSIS — Z0000 Encounter for general adult medical examination without abnormal findings: Secondary | ICD-10-CM | POA: Diagnosis not present

## 2018-11-28 NOTE — Patient Instructions (Addendum)
Health Maintenance Due  Topic Date Due  . PAP SMEAR-Sign release of information at the check out desk for last pap smear and mamogram and labwork from Dr. Garwin Brothers  Also get records for last labs from Dr. Collene Mares please- sign a separate Release of information  04/04/1994  . INFLUENZA VACCINE Administer today 05/18/2018   Please stop by lab before you go If you do not have mychart- we will call you about results within 5 business days of [place] receiving them.  If you have mychart- we will send your results within 3 business days of [place] receiving them.  If abnormal or we want to clarify a result, we will call or mychart you to make sure you receive the message.  If you have questions or concerns or don't hear within 5-7 days, please send [place] a message or call us.   Sorry about confusion on Tb skin testing- will get quantiferon gold- you do not need to come back for tb skin test to be read since we are doing the blood test.   Let us know if you need anything!

## 2018-11-28 NOTE — Progress Notes (Signed)
Phone: [phone]   Subjective:  Patient presents today for their annual physical. Chief complaint-noted.   See problem oriented charting- ROS- full  review of systems was completed and negative including No chest pain or shortness of breath  BMI monitoring- elevated BMI noted: Body mass index is 29.15 kg/m. Encouraged need for healthy eating, regular exercise, weight loss.     BMI Metric Follow Up - 11/28/18 1253      BMI Metric Follow Up-Please document annually   BMI Metric Follow Up  Education provided       The following were reviewed and entered/updated in epic: Past Medical History:  Diagnosis Date  . Chronic cholecystitis   . GERD (gastroesophageal reflux disease)    Tums prn, worse in pregnancy  . HSV infection    Has never had an outbreak on valtrex, fever blister  . Miscarriage   . Preglaucoma, unspecified    elevated IOP/  right eye  . Unspecified iridocyclitis    right eye  . Uveitis    Patient Active Problem List   Diagnosis Date Noted  . Uveitis     Priority: High  . Family history of colon cancer 08/21/2015    Priority: Low  . GERD (gastroesophageal reflux disease)     Priority: Low  . Latent tuberculosis by skin test 10/15/2013  . Scleritis and episcleritis 10/09/2013  . S/P cataract extraction 02/02/2013   Past Surgical History:  Procedure Laterality Date  . ANAL FISSURE REPAIR  AGE 5  . CATARACT EXTRACTION W/ INTRAOCULAR LENS IMPLANT Right APRIL 2014  . CESAREAN SECTION N/A 04/22/2016   Procedure: CESAREAN SECTION;  Surgeon: Servando Salina, MD;  Location: [facility];  Service: Obstetrics;  Laterality: N/A;  . CHOLECYSTECTOMY N/A 09/05/2017   Procedure: LAPAROSCOPIC CHOLECYSTECTOMY;  Surgeon: Coralie Keens, MD;  Location: [facility];  Service: General;  Laterality: N/A;  . DILATION AND EVACUATION N/A 08/21/2013   miscarriage    Family History  Problem Relation Age of Onset  . Diabetes Mother   . Hypertension  Mother   . Diabetes Father   . Hypertension Father   . Cancer Father        Colon 102, lung- former smoker 29  . Hyperlipidemia Father        mother    Medications- reviewed and updated Current Outpatient Medications  Medication Sig Dispense Refill  . Black Pepper-Turmeric (TURMERIC CURCUMIN) 02-999 MG CAPS Take by mouth.    . Omega-3 Fatty Acids (FISH OIL CONCENTRATE PO) Take by mouth.    . Red Yeast Rice Extract (RED YEAST RICE PO) Take by mouth.     No current facility-administered medications for this visit.     Allergies-reviewed and updated No Known Allergies  Social History   Social History Narrative   Married. 3 children- [name] (sounds like isla)  and  [name]- daughters. Youngest child born 2017      Studying for FNP- [employer] online- will do clinicals around the area though. Should be starting clinicals 2020.    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 Goes for walks with kids. Enjoys shopping/spending money.          Objective  Objective:  BP 108/68 (BP Location: Left Arm, Patient Position: Sitting, Cuff Size: Normal)   Pulse 78   Temp 98.3 F (36.8 C) (Oral)   Resp  18   Ht 5\' 2"  (1.575 m)   Wt 159 lb 6.4 oz (72.3 kg)   SpO2 97%   BMI 29.15 kg/m  Gen: NAD, resting comfortably HEENT: Mucous membranes are moist. Oropharynx normal Neck: no thyromegaly CV: RRR no murmurs rubs or gallops Lungs: CTAB no crackles, wheeze, rhonchi Abdomen: soft/nontender/nondistended/normal bowel sounds. No rebound or guarding.  Ext: no edema Skin: warm, dry Neuro: grossly normal, moves all extremities, PERRLA   Assessment and Plan    48 y.o. female presenting for annual physical.  Health Maintenance counseling: 1. Anticipatory guidance: Patient counseled regarding regular dental exams -q6 months, eye exams - about every 3 months,  avoiding smoking and second hand smoke ,  limiting alcohol to 1 beverage per day- doesn't drink .   2. Risk factor reduction:  Advised patient of need for regular exercise and diet rich and fruits and vegetables to reduce risk of heart attack and stroke. Exercise- 3x a week. Diet-keto diet has been helpful- she is down 26 lbs per her readings- she wants to lose another 20.  Wt Readings from Last 3 Encounters:  11/28/18 159 lb 6.4 oz (72.3 kg)  05/24/18 168 lb 12.8 oz (76.6 kg)  02/27/18 176 lb 9.6 oz (80.1 kg)  3. Immunizations/screenings/ancillary studies- flu shot given today Immunization History  Administered Date(s) Administered  . Influenza,inj,Quad PF,6+ Mos 11/28/2018  . Influenza-Unspecified 07/28/2015, 08/18/2016  . Tdap 08/10/2012  4. Cervical cancer screening- will get records from GYN for last pap done last year 5. Breast cancer screening-  breast exam with GYN and mammogram - will get records 6. Colon cancer screening - September 2019 with 5 year follow up 7. Skin cancer screening-  Saw dermatologist within last few years (mainly for fungus in nail) advised regular sunscreen use. Denies worrisome, changing, or new skin lesions.  8. Birth control/STD check- IUD in place. monogomous 9. Osteoporosis screening at 10- will plan on this -10. never smoker  Status of chronic or acute concerns   Uveitis/scleritis history- follows with optho at [facility] through 2017, now going to [facility]. Intermittent flares- responds to low dose steroids - has already had cataract out  Gerd- prn tums in past- pretty much gone with weight loss  history of vertigo. Reassuring mri 10/2016- likely BPPV.   Low back pain- worked with Dr. Paulla Fore last year- he was a huge help to her.   Patient has been immunized from tuberculosis while living in [place] for referral for elevated LFTs gradual hemochromatosis and alpha-1 antitrypsin- we did give PPD before I realized that- we will check quanterferon  gold instead  We will need to  have her pick up a form from [employer]-I have prefilled out most of this but waiting on quantifying gold test  1 year follow up  Lab/Order associations: fasting status not applicable  Preventative health care  Positive PPD - Plan: QuantiFERON-TB Gold Plus  Need for influenza vaccination - Plan: Flu Vaccine QUAD 6+ mos PF IM (Fluarix Quad PF)  BMI 29.0-29.9,adult  Return precautions advised.  Garret Reddish, MD

## 2018-11-30 ENCOUNTER — Encounter: Payer: Self-pay | Admitting: Family Medicine

## 2018-11-30 LAB — QUANTIFERON-TB GOLD PLUS
Mitogen-NIL: >10 IU/mL
NIL: 0.06 IU/mL
QuantiFERON-TB Gold Plus: NEGATIVE
TB1-NIL: 0.11 IU/mL
TB2-NIL: 0.06 IU/mL

## 2018-12-04 ENCOUNTER — Telehealth: Payer: Self-pay | Admitting: Family Medicine

## 2018-12-04 NOTE — Telephone Encounter (Signed)
See note

## 2018-12-04 NOTE — Telephone Encounter (Signed)
Spoke to pt and informed her of lab results. Pt will be notified when forms are completed and ready for pick up.

## 2018-12-04 NOTE — Telephone Encounter (Signed)
Copied from [place] [id number]. Topic: Quick Communication - See Telephone Encounter >> Dec 04, 2018  4:30 PM Blase Mess A wrote: CRM for notification. See Telephone encounter for: 12/04/18.  Patient is returning Kendra's call Please advise [phone]

## 2018-12-04 NOTE — Telephone Encounter (Signed)
Pt notified that form is ready for pick up. Forms are located in the front.

## 2018-12-07 ENCOUNTER — Encounter: Payer: Self-pay | Admitting: Family Medicine

## 2018-12-08 DIAGNOSIS — H2000 Unspecified acute and subacute iridocyclitis: Secondary | ICD-10-CM | POA: Diagnosis not present

## 2018-12-12 ENCOUNTER — Ambulatory Visit: Payer: 59

## 2018-12-21 LAB — CHLAMYDIA/GONOCOCCUS/TRICHOMONAS, NAA
Chlamydia trachomatis, NAA: NEGATIVE
Neisseria gonorrhoeae, NAA: NEGATIVE

## 2018-12-22 MED FILL — PREDNISOLONE AC 1% EYE DROP: 1 | 50 days supply | Qty: 10 | Fill #0

## 2019-03-27 ENCOUNTER — Telehealth: Payer: Self-pay | Admitting: Family Medicine

## 2019-03-27 ENCOUNTER — Other Ambulatory Visit: Payer: Self-pay

## 2019-03-27 ENCOUNTER — Ambulatory Visit (INDEPENDENT_AMBULATORY_CARE_PROVIDER_SITE_OTHER): Payer: 59 | Admitting: Family Medicine

## 2019-03-27 ENCOUNTER — Encounter: Payer: Self-pay | Admitting: Family Medicine

## 2019-03-27 VITALS — BP 102/63 | HR 77 | Temp 98.6°F | Ht 62.0 in | Wt 158.6 lb

## 2019-03-27 DIAGNOSIS — H8110 Benign paroxysmal vertigo, unspecified ear: Secondary | ICD-10-CM

## 2019-03-27 NOTE — Telephone Encounter (Signed)
Copied from [place] [id number]. Topic: Quick Communication - See Telephone Encounter >> Mar 27, 2019  9:04 AM Robina Ade, Helene Kelp D wrote: CRM for notification. See Telephone encounter for: 03/27/19. Patient called and would like to talk to Dr. Yong Channel or his CMA about her vertigo and needing a note explaining her situation to a travel agent that she had to cancel a trip with. Please call patient back, thanks.

## 2019-03-27 NOTE — Progress Notes (Signed)
Phone [phone]   Subjective:  Renee [name] is a 48 y.o. year old very pleasant female patient who presents for/with See problem oriented charting Chief Complaint  Patient presents with  . Dizziness    Sx started 3 days ago and has gotten worse.    ROS- admits to vertigo, nausea, vomiting - when vertigo severe. No fever, chills, cough, shortness of breath, body aches, sore throat, or loss of taste or smell  Past Medical History-  Patient Active Problem List   Diagnosis Date Noted  . Uveitis     Priority: High  . Family history of colon cancer 08/21/2015    Priority: Low  . GERD (gastroesophageal reflux disease)     Priority: Low  . Latent tuberculosis by skin test 10/15/2013  . Scleritis and episcleritis 10/09/2013  . S/P cataract extraction 02/02/2013    Medications- reviewed and updated Current Outpatient Medications  Medication Sig Dispense Refill  . Black Pepper-Turmeric (TURMERIC CURCUMIN) 02-999 MG CAPS Take by mouth.    . Omega-3 Fatty Acids (FISH OIL CONCENTRATE PO) Take by mouth.    . Red Yeast Rice Extract (RED YEAST RICE PO) Take by mouth.     No current facility-administered medications for this visit.      Objective:  BP 102/63   Pulse 77   Temp 98.6 F (37 C) (Oral)   Ht 5\' 2"  (1.575 m)   Wt 158 lb 9.6 oz (71.9 kg)   SpO2 99%   BMI 29.01 kg/m  Gen: NAD, resting comfortably CV: RRR no murmurs rubs or gallops Lungs: CTAB no crackles, wheeze, rhonchi Abdomen: soft/nontender/nondistended/normal bowel sounds. Ext: no edema Skin: warm, dry Neuro: grossly normal, moves all extremities, moves cautiously and slowly trying to avoid rapid moements. Did not perform dix hallpike as may worsen symptoms before drive home.     Assessment and Plan   #vertigo-likely due to BPPV S: Patient with history of Vertigo. Had MRI brain 10/29/16 which was reassuring.   Current episode started Sunday night. Feeling vertigo. When would get up to go to the bathroom issues  would worsen. Did have nausea and vomiting on Sunday. Cousin with PHD in physical therapy walked husband through epley maneuvers and he found that helpful. Tries to avoid laying flat as this seems to worsen things. No ringing in ears or hearing loss.   zofran helps with nausea and meclizine. Worse with head movement. Motion in car bothers her. She was able to drive here today as doesn't live far but drove /specifically turned slowly and was very cautious with head movement. She does not feel prepared for long trim. Family had Booked a house in [place]- they were supposed to go tomorrow- this is a disappointment for the family.   She would be interested in formal PT for vestibular rehabilitation.3 little ones in house and doing clinicals- doesn't think he can make it to pt.  2 sisters with similar issues-vertigo issues A/P: Flareup of likely BPPV-given improvement with Epley maneuvers.  Given prior diagnosis and improvement with Epley maneuvers-we opted not to repeat Dix-Hallpike maneuver today as she does not want to trigger an episode before going home.  She will continue meclizine and Zofran as needed-I can refill Zofran or meclizine if needed.  They were traveling to [place] originally starting tomorrow. She is not yet in a position where she can travel- will need more time with epley maneuvers to feel comfortable in a vehicle-I wrote a letter to [employer] in support of her not traveling.  Lab/Order associations: Benign paroxysmal positional vertigo, unspecified laterality  Return precautions advised.  Garret Reddish, MD

## 2019-03-27 NOTE — Telephone Encounter (Signed)
This has been taking care of. Pt has been set up for in office visit.

## 2019-03-27 NOTE — Patient Instructions (Addendum)
I'm glad your husband is able to do epley maneuvers for you- if you find time/space and want formal PT referral just let us know  Writing letter as I don't think its safe for you to travel right now  Continue zofran and meclizine as needed

## 2019-04-06 DIAGNOSIS — Z20828 Contact with and (suspected) exposure to other viral communicable diseases: Secondary | ICD-10-CM | POA: Diagnosis not present

## 2019-05-15 DIAGNOSIS — L308 Other specified dermatitis: Secondary | ICD-10-CM | POA: Diagnosis not present

## 2019-05-15 DIAGNOSIS — L293 Anogenital pruritus, unspecified: Secondary | ICD-10-CM | POA: Diagnosis not present

## 2019-05-24 MED FILL — PREDNISOLONE AC 1% EYE DROP: 1 | 50 days supply | Qty: 10 | Fill #1

## 2019-06-11 MED FILL — AMOXICILLIN 500 MG CAPSULE: 500 | 5 days supply | Qty: 15 | Fill #0

## 2019-07-04 DIAGNOSIS — H2000 Unspecified acute and subacute iridocyclitis: Secondary | ICD-10-CM | POA: Diagnosis not present

## 2019-07-04 DIAGNOSIS — H40051 Ocular hypertension, right eye: Secondary | ICD-10-CM | POA: Diagnosis not present

## 2019-07-31 DIAGNOSIS — H15101 Unspecified episcleritis, right eye: Secondary | ICD-10-CM | POA: Diagnosis not present

## 2019-07-31 DIAGNOSIS — H15001 Unspecified scleritis, right eye: Secondary | ICD-10-CM | POA: Diagnosis not present

## 2019-07-31 DIAGNOSIS — Z961 Presence of intraocular lens: Secondary | ICD-10-CM | POA: Diagnosis not present

## 2019-07-31 DIAGNOSIS — H40051 Ocular hypertension, right eye: Secondary | ICD-10-CM | POA: Diagnosis not present

## 2019-07-31 DIAGNOSIS — H53002 Unspecified amblyopia, left eye: Secondary | ICD-10-CM | POA: Diagnosis not present

## 2019-07-31 DIAGNOSIS — H35372 Puckering of macula, left eye: Secondary | ICD-10-CM | POA: Diagnosis not present

## 2019-07-31 MED FILL — LOTEPREDNOL ETABONATE 0.5 %: 0.5 | 50 days supply | Qty: 5 | Fill #0

## 2019-07-31 MED FILL — COMBIGAN EYE DROPS: 0.2-0.5 | 50 days supply | Qty: 5 | Fill #0

## 2019-08-03 DIAGNOSIS — H15001 Unspecified scleritis, right eye: Secondary | ICD-10-CM | POA: Diagnosis not present

## 2019-08-28 DIAGNOSIS — Z961 Presence of intraocular lens: Secondary | ICD-10-CM | POA: Diagnosis not present

## 2019-08-28 DIAGNOSIS — H35372 Puckering of macula, left eye: Secondary | ICD-10-CM | POA: Diagnosis not present

## 2019-08-28 DIAGNOSIS — H53002 Unspecified amblyopia, left eye: Secondary | ICD-10-CM | POA: Diagnosis not present

## 2019-08-28 DIAGNOSIS — H15001 Unspecified scleritis, right eye: Secondary | ICD-10-CM | POA: Diagnosis not present

## 2019-08-28 DIAGNOSIS — H15101 Unspecified episcleritis, right eye: Secondary | ICD-10-CM | POA: Diagnosis not present

## 2019-08-28 DIAGNOSIS — H40051 Ocular hypertension, right eye: Secondary | ICD-10-CM | POA: Diagnosis not present

## 2019-09-11 DIAGNOSIS — Z1151 Encounter for screening for human papillomavirus (HPV): Secondary | ICD-10-CM | POA: Diagnosis not present

## 2019-09-11 DIAGNOSIS — Z01419 Encounter for gynecological examination (general) (routine) without abnormal findings: Secondary | ICD-10-CM | POA: Diagnosis not present

## 2019-09-11 DIAGNOSIS — Z6830 Body mass index (BMI) 30.0-30.9, adult: Secondary | ICD-10-CM | POA: Diagnosis not present

## 2019-09-21 MED FILL — COMBIGAN EYE DROPS: 0.2-0.5 | 50 days supply | Qty: 5 | Fill #1

## 2019-09-21 MED FILL — LOTEPREDNOL ETABONATE 0.5 %: 0.5 | 50 days supply | Qty: 5 | Fill #1

## 2019-09-26 MED FILL — VALACYCLOVIR HCL 500 MG TAB: 500 | 3 days supply | Qty: 6 | Fill #0

## 2019-10-17 MED FILL — METHOTREXATE SODIUM 2.5 MG: 2.5 | 28 days supply | Qty: 16 | Fill #0

## 2019-10-23 DIAGNOSIS — H53002 Unspecified amblyopia, left eye: Secondary | ICD-10-CM | POA: Diagnosis not present

## 2019-10-23 DIAGNOSIS — H40051 Ocular hypertension, right eye: Secondary | ICD-10-CM | POA: Diagnosis not present

## 2019-10-23 DIAGNOSIS — Z961 Presence of intraocular lens: Secondary | ICD-10-CM | POA: Diagnosis not present

## 2019-10-23 DIAGNOSIS — H15101 Unspecified episcleritis, right eye: Secondary | ICD-10-CM | POA: Diagnosis not present

## 2019-10-23 DIAGNOSIS — H15001 Unspecified scleritis, right eye: Secondary | ICD-10-CM | POA: Diagnosis not present

## 2019-10-23 DIAGNOSIS — H35372 Puckering of macula, left eye: Secondary | ICD-10-CM | POA: Diagnosis not present

## 2019-10-23 MED FILL — FOLIC ACID 1 MG TABS: 1 | 90 days supply | Qty: 450 | Fill #0

## 2019-10-24 DIAGNOSIS — H15101 Unspecified episcleritis, right eye: Secondary | ICD-10-CM | POA: Diagnosis not present

## 2019-10-24 DIAGNOSIS — H15001 Unspecified scleritis, right eye: Secondary | ICD-10-CM | POA: Diagnosis not present

## 2019-10-24 MED FILL — METHOTREXATE SODIUM 2.5 MG: 2.5 | 21 days supply | Qty: 16 | Fill #0

## 2019-10-26 DIAGNOSIS — Z1231 Encounter for screening mammogram for malignant neoplasm of breast: Secondary | ICD-10-CM | POA: Diagnosis not present

## 2019-10-30 ENCOUNTER — Ambulatory Visit (INDEPENDENT_AMBULATORY_CARE_PROVIDER_SITE_OTHER): Payer: 59 | Admitting: Family Medicine

## 2019-10-30 ENCOUNTER — Other Ambulatory Visit: Payer: Self-pay

## 2019-10-30 ENCOUNTER — Encounter: Payer: Self-pay | Admitting: Family Medicine

## 2019-10-30 ENCOUNTER — Telehealth: Payer: Self-pay | Admitting: Family Medicine

## 2019-10-30 VITALS — Ht 62.0 in | Wt 158.0 lb

## 2019-10-30 DIAGNOSIS — Z0000 Encounter for general adult medical examination without abnormal findings: Secondary | ICD-10-CM

## 2019-10-30 DIAGNOSIS — Z111 Encounter for screening for respiratory tuberculosis: Secondary | ICD-10-CM

## 2019-10-30 DIAGNOSIS — H209 Unspecified iridocyclitis: Secondary | ICD-10-CM | POA: Diagnosis not present

## 2019-10-30 DIAGNOSIS — Z23 Encounter for immunization: Secondary | ICD-10-CM

## 2019-10-30 NOTE — Assessment & Plan Note (Signed)
Patient reports her pressures were increasing.  She had to be started on methotrexate for uveitis-apparently this has been helpful and she has been tolerating this

## 2019-10-30 NOTE — Telephone Encounter (Signed)
error 

## 2019-10-30 NOTE — Patient Instructions (Signed)
There are no preventive care reminders to display for this patient.  Depression screen PHQ 2/9 11/28/2018  Decreased Interest 0  Down, Depressed, Hopeless 0  PHQ - 2 Score 0    Recommended follow up: No follow-ups on file.

## 2019-10-30 NOTE — Progress Notes (Signed)
Phone [phone] Virtual visit via Video note   Subjective:  Chief complaint: discuss health maintenance and needs for FNP program  This visit type was conducted due to national recommendations for restrictions regarding the COVID-19 Pandemic (e.g. social distancing).  This format is felt to be most appropriate for this patient at this time balancing risks to patient and risks to population by having him in for in person visit.  No physical exam was performed (except for noted visual exam or audio findings with Telehealth visits).    Our team/I connected with Kaitlinn Tafoya at 11:40 AM EST by a video enabled telemedicine application (doxy.me or caregility through epic) and verified that I am speaking with the correct person using two identifiers.  Location patient: [place] Location provider: [facility], office Persons participating in the virtual visit:  patient  Our team/I discussed the limitations of evaluation and management by telemedicine and the availability of in person appointments. In light of current covid-19 pandemic, patient also understands that we are trying to protect them by minimizing in office contact if at all possible.  The patient expressed consent for telemedicine visit and agreed to proceed. Patient understands insurance will be billed.   Past Medical History-  Patient Active Problem List   Diagnosis Date Noted  . Uveitis     Priority: High  . Immunization, BCG 10/30/2019    Priority: Low  . Family history of colon cancer 08/21/2015    Priority: Low  . GERD (gastroesophageal reflux disease)     Priority: Low  . Scleritis and episcleritis 10/09/2013  . S/P cataract extraction 02/02/2013    Medications- reviewed and updated Current Outpatient Medications  Medication Sig Dispense Refill  . Black Pepper-Turmeric (TURMERIC CURCUMIN) 02-999 MG CAPS Take by mouth.    . brimonidine-timolol (COMBIGAN) 0.2-0.5 % ophthalmic solution Place 1 drop into the right eye  every 12 hours.    . folic acid (FOLVITE) 1 MG tablet Take by mouth.    . loteprednol (LOTEMAX) 0.5 % ophthalmic suspension Place 1 drop into the right eye in the morning and at bedtime.    . methotrexate (RHEUMATREX) 2.5 MG tablet Take 50 mg by mouth once a week.    . Omega-3 Fatty Acids (FISH OIL CONCENTRATE PO) Take by mouth.    . Red Yeast Rice Extract (RED YEAST RICE PO) Take by mouth.     No current facility-administered medications for this visit.     Objective:  Ht 5\' 2"  (1.575 m)   Wt 158 lb (71.7 kg)   BMI 28.90 kg/m  self reported vitals Gen: NAD, resting comfortably Lungs: nonlabored, normal respiratory rate  Skin: appears dry, no obvious rash     Assessment and Plan   #Health maintenance discussion S:Patient needs to have Quantiferon gold drawn for school.  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  Reports has had intermittent contact with COVID-19 patients.  She asked about COVID-19 vaccination already received A/P: We will set patient up for quantifying gold testing.  She will come by tomorrow for this.  In regards to vaccination-I encouraged her to go ahead and receive vaccine she technically would be part of phase 1a since she has had contact with COVID-19 patients as part of her rotations and my understanding.  At the very latest that she would be in phase 1B group 3.  Dr. Brigitte Pulse- methotrexate helping. Uveitis- pressure was increasing   Uveitis Patient reports her pressures were increasing.  She had to be started on methotrexate for uveitis-apparently this has been helpful and she has been tolerating this   Recommended follow up:  Future Appointments  Date Time Provider [place]  10/31/2019  4:00 PM [facility]   Lab/Order associations:   ICD-10-CM   1. Screening for tuberculosis  Z11.1 QuantiFERON-TB  Gold Plus  2. Immunization, BCG  Z23   3. Uveitis  H20.9   4. Healthcare maintenance  Z00.00    Time Spent: 11 minutes of total time (12:09 AM- 12:16 PM , 12:37- 12:41 PM) was spent on the date of the encounter performing the following actions: chart review prior to seeing the patient, obtaining history, performing a medically necessary exam, counseling on the treatment plan, placing orders, and documenting in our EHR.   Return precautions advised.  Garret Reddish, MD

## 2019-10-31 ENCOUNTER — Other Ambulatory Visit: Payer: 59

## 2019-10-31 DIAGNOSIS — Z111 Encounter for screening for respiratory tuberculosis: Secondary | ICD-10-CM | POA: Diagnosis not present

## 2019-11-02 LAB — QUANTIFERON-TB GOLD PLUS
Mitogen-NIL: >10 IU/mL
NIL: 0.1 IU/mL
QuantiFERON-TB Gold Plus: NEGATIVE
TB1-NIL: 0.23 IU/mL
TB2-NIL: 0.14 IU/mL

## 2019-11-05 MED FILL — LOTEPREDNOL ETABONATE 0.5 %: 0.5 | 37 days supply | Qty: 5 | Fill #2

## 2019-11-05 MED FILL — COMBIGAN EYE DROPS: 0.2-0.5 | 37 days supply | Qty: 5 | Fill #2

## 2019-11-07 MED FILL — METHOTREXATE SODIUM 2.5 MG: 2.5 | 28 days supply | Qty: 24 | Fill #0

## 2019-11-29 MED FILL — METHOTREXATE SODIUM 2.5 MG: 2.5 | 28 days supply | Qty: 24 | Fill #0

## 2019-12-18 ENCOUNTER — Other Ambulatory Visit (HOSPITAL_COMMUNITY): Payer: Self-pay | Admitting: Ophthalmology

## 2019-12-18 DIAGNOSIS — Z961 Presence of intraocular lens: Secondary | ICD-10-CM | POA: Diagnosis not present

## 2019-12-18 DIAGNOSIS — H35372 Puckering of macula, left eye: Secondary | ICD-10-CM | POA: Diagnosis not present

## 2019-12-18 DIAGNOSIS — H15001 Unspecified scleritis, right eye: Secondary | ICD-10-CM | POA: Diagnosis not present

## 2019-12-18 DIAGNOSIS — H53002 Unspecified amblyopia, left eye: Secondary | ICD-10-CM | POA: Diagnosis not present

## 2019-12-18 DIAGNOSIS — H15101 Unspecified episcleritis, right eye: Secondary | ICD-10-CM | POA: Diagnosis not present

## 2019-12-18 DIAGNOSIS — H40051 Ocular hypertension, right eye: Secondary | ICD-10-CM | POA: Diagnosis not present

## 2019-12-18 MED FILL — COMBIGAN EYE DROPS: 0.2-0.5 | 50 days supply | Qty: 5 | Fill #0

## 2020-01-03 MED FILL — METHOTREXATE SODIUM 2.5 MG: 2.5 | 28 days supply | Qty: 24 | Fill #1

## 2020-01-31 MED FILL — METHOTREXATE SODIUM 2.5 MG: 2.5 | 28 days supply | Qty: 24 | Fill #2

## 2020-01-31 MED FILL — FOLIC ACID 1 MG TABS: 1 | 90 days supply | Qty: 450 | Fill #1

## 2020-02-22 DIAGNOSIS — L603 Nail dystrophy: Secondary | ICD-10-CM | POA: Diagnosis not present

## 2020-02-22 DIAGNOSIS — Z411 Encounter for cosmetic surgery: Secondary | ICD-10-CM | POA: Diagnosis not present

## 2020-03-11 DIAGNOSIS — Z961 Presence of intraocular lens: Secondary | ICD-10-CM | POA: Diagnosis not present

## 2020-03-11 DIAGNOSIS — H15001 Unspecified scleritis, right eye: Secondary | ICD-10-CM | POA: Diagnosis not present

## 2020-03-11 DIAGNOSIS — H15101 Unspecified episcleritis, right eye: Secondary | ICD-10-CM | POA: Diagnosis not present

## 2020-03-11 DIAGNOSIS — H53002 Unspecified amblyopia, left eye: Secondary | ICD-10-CM | POA: Diagnosis not present

## 2020-03-11 DIAGNOSIS — H40051 Ocular hypertension, right eye: Secondary | ICD-10-CM | POA: Diagnosis not present

## 2020-03-11 DIAGNOSIS — H35372 Puckering of macula, left eye: Secondary | ICD-10-CM | POA: Diagnosis not present

## 2020-03-11 DIAGNOSIS — Z79899 Other long term (current) drug therapy: Secondary | ICD-10-CM | POA: Diagnosis not present

## 2020-04-08 ENCOUNTER — Telehealth: Payer: Self-pay | Admitting: Family Medicine

## 2020-04-08 MED FILL — predniSONE 5 MG TABS: 5 | 8 days supply | Qty: 36 | Fill #0

## 2020-04-08 MED FILL — DICLOFENAC SODIUM 1 % GEL: 1 | 30 days supply | Qty: 100 | Fill #0

## 2020-04-08 NOTE — Telephone Encounter (Signed)
FYI, patient has not been to ER yet

## 2020-04-08 NOTE — Telephone Encounter (Signed)
Nurse Assessment Nurse: Vallery Sa, RN, Cathy Date/Time (Eastern Time): 04/08/2020 9:56:22 AM Confirm and document reason for call. If symptomatic, describe symptoms. ---Renee [name] states she developed severe bilateral flank pain about 3 days ago (current pain rated as a 10 on the 1 to 10 scale). She last passed urine about 15 minutes ago. No fever. Alert and responsive. Has the patient had close contact with a person known or suspected to have the novel coronavirus illness OR traveled / lives in area with major community spread (including international travel) in the last 14 days from the onset of symptoms? * If Asymptomatic, screen for exposure and travel within the last 14 days. ---No Does the patient have any new or worsening symptoms? ---Yes Will a triage be completed? ---Yes Related visit to physician within the last 2 weeks? ---No Does the PT have any chronic conditions? (i.e. diabetes, asthma, this includes High risk factors for pregnancy, etc.) ---Yes List chronic conditions. ---Eye problems Is the patient pregnant or possibly pregnant? (Ask all females between the ages of 37-55) ---No Is this a behavioral health or substance abuse call? ---NoPLEASE NOTE: All timestamps contained within this report are represented as [place] Standard Time. CONFIDENTIALTY NOTICE: This fax transmission is intended only for the addressee. It contains information that is legally privileged, confidential or otherwise protected from use or disclosure. If you are not the intended recipient, you are strictly prohibited from reviewing, disclosing, copying using or disseminating any of this information or taking any action in reliance on or regarding this information. If you have received this fax in error, please notify us immediately by telephone so that we can arrange for its return to [place]. Phone: [phone], Toll-Free: [phone], Fax: [phone] Page: 2 of 2 Call Id: [id number] Guidelines Guideline  Title Affirmed Question Affirmed Notes Nurse Date/Time Eilene Ghazi Time) Flank Pain [1] SEVERE pain (e.g., excruciating, scale 8-10) AND [2] present > 1 hour Vallery Sa, RN, [facility] 04/08/2020 9:59:48 AM Disp. Time Eilene Ghazi Time) Disposition Final User 04/08/2020 10:01:24 AM Go to ED Now Yes Vallery Sa, RN, [facility] Disagree/Comply Comply Caller Understands Yes PreDisposition Did not know what to do Care Advice Given Per Guideline GO TO ED NOW: * You need to be seen in the Emergency Department. * Go to the ED at ___________ [place] now. Drive carefully. ANOTHER ADULT SHOULD DRIVE: * It is better and safer if another adult drives instead of you. CARE ADVICE given per Flank Pain (Adult) guideline. BRING MEDICINES: * Please bring a list of your current medicines when you go to the Emergency Department (ER). * It is also a good idea to bring the pill bottles too. This will help the doctor to make certain you are taking the right medicines and the right dose. Referrals GO TO FACILITY OTHER - SPEC[facility]

## 2020-04-10 NOTE — Telephone Encounter (Signed)
Tried to contact pt mailbox is full unable to leave message. 

## 2020-04-10 NOTE — Telephone Encounter (Signed)
Im out of office but please call back to check on her. 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

## 2020-04-11 NOTE — Telephone Encounter (Signed)
Called patient she is doing much better. She did take some prednisone and otc ibuprofen

## 2020-04-12 DIAGNOSIS — Z20822 Contact with and (suspected) exposure to covid-19: Secondary | ICD-10-CM | POA: Diagnosis not present

## 2020-04-14 MED FILL — FOLIC ACID 1 MG TABS: 1 | 90 days supply | Qty: 450 | Fill #2

## 2020-04-14 NOTE — Telephone Encounter (Signed)
Noted - im available if needed this week

## 2020-06-13 MED FILL — METHOTREXATE SODIUM 2.5 MG: 2.5 | 84 days supply | Qty: 72 | Fill #1

## 2020-06-13 MED FILL — LOTEPREDNOL ETABONATE 0.5 %: 0.5 | 37 days supply | Qty: 5 | Fill #3

## 2020-06-22 DIAGNOSIS — Z20822 Contact with and (suspected) exposure to covid-19: Secondary | ICD-10-CM | POA: Diagnosis not present

## 2020-06-24 ENCOUNTER — Other Ambulatory Visit (HOSPITAL_COMMUNITY): Payer: Self-pay | Admitting: Ophthalmology

## 2020-06-24 DIAGNOSIS — H15101 Unspecified episcleritis, right eye: Secondary | ICD-10-CM | POA: Diagnosis not present

## 2020-06-24 DIAGNOSIS — H40051 Ocular hypertension, right eye: Secondary | ICD-10-CM | POA: Diagnosis not present

## 2020-06-24 DIAGNOSIS — H53002 Unspecified amblyopia, left eye: Secondary | ICD-10-CM | POA: Diagnosis not present

## 2020-06-24 DIAGNOSIS — H35372 Puckering of macula, left eye: Secondary | ICD-10-CM | POA: Diagnosis not present

## 2020-06-24 DIAGNOSIS — H15001 Unspecified scleritis, right eye: Secondary | ICD-10-CM | POA: Diagnosis not present

## 2020-06-24 DIAGNOSIS — Z79899 Other long term (current) drug therapy: Secondary | ICD-10-CM | POA: Diagnosis not present

## 2020-06-24 DIAGNOSIS — Z961 Presence of intraocular lens: Secondary | ICD-10-CM | POA: Diagnosis not present

## 2020-06-26 ENCOUNTER — Ambulatory Visit (INDEPENDENT_AMBULATORY_CARE_PROVIDER_SITE_OTHER): Payer: 59 | Admitting: Family Medicine

## 2020-06-26 ENCOUNTER — Other Ambulatory Visit: Payer: Self-pay

## 2020-06-26 ENCOUNTER — Encounter: Payer: Self-pay | Admitting: Family Medicine

## 2020-06-26 ENCOUNTER — Ambulatory Visit (INDEPENDENT_AMBULATORY_CARE_PROVIDER_SITE_OTHER): Payer: 59

## 2020-06-26 VITALS — BP 110/78 | HR 77 | Ht 62.0 in | Wt 158.0 lb

## 2020-06-26 DIAGNOSIS — M545 Low back pain, unspecified: Secondary | ICD-10-CM | POA: Insufficient documentation

## 2020-06-26 DIAGNOSIS — H8111 Benign paroxysmal vertigo, right ear: Secondary | ICD-10-CM

## 2020-06-26 DIAGNOSIS — G8929 Other chronic pain: Secondary | ICD-10-CM | POA: Diagnosis not present

## 2020-06-26 DIAGNOSIS — H8110 Benign paroxysmal vertigo, unspecified ear: Secondary | ICD-10-CM | POA: Insufficient documentation

## 2020-06-26 MED ORDER — GABAPENTIN 100 MG PO CAPS: 200.0000 mg | ORAL_CAPSULE | Freq: Every day | ORAL | 0 refills | Status: DC

## 2020-06-26 MED FILL — GABAPENTIN 100 MG CAPSULE: 100 | 90 days supply | Qty: 180 | Fill #0

## 2020-06-26 NOTE — Assessment & Plan Note (Signed)
Low back pain, seems to be multifactorial, does have some muscle imbalances, patient has gained weight over the course of the last couple years she states. Discussed potential weight loss, core strengthening exercises, gabapentin given for nighttime relief, discussed rotating mattress, x-rays pending to further evaluate. Patient will increase activity as tolerated follow-up again in 4 to 6 weeks. Worsening symptoms consider the possibility of formal physical therapy as well as the possibility of osteopathic manipulation.

## 2020-06-26 NOTE — Patient Instructions (Signed)
Xray today Gabapentin 200mg  at night Good to see you.  Ice 20 minutes 2 times daily. Usually after activity and before bed. Exercises 3 times a week. .  Turmeric 500mg  daily  Tart cherry extract 1200mg  at night Vitamin D 2000 IU daily Flonase daily in each nostril for next 2 weeks Rotate mattress  See me again in 5-6 weeks

## 2020-06-26 NOTE — Assessment & Plan Note (Signed)
Questionable inner ear, will try Flonase, if continuing to have difficulty follow-up with primary care provider

## 2020-06-26 NOTE — Progress Notes (Signed)
[place] Phone: [phone] Subjective:   Renee [name], am serving as a scribe for Dr. Hulan Saas. This visit occurred during the SARS-CoV-2 public health emergency.  Safety protocols were in place, including screening questions prior to the visit, additional usage of staff PPE, and extensive cleaning of exam room while observing appropriate contact time as indicated for disinfecting solutions.   I'm seeing this patient by the request  of:  Marin Olp, MD  CC: Low back pain  [id number]  Renee [name] is a 49 y.o. female coming in with complaint of low back pain. Saw Dr. Paulla Fore in 2019. Patient suffers from vertigo and has intermittent flares. Is wondering if this is coming from tight muscles.   Lower back pain R>L. Denies any radiating symptoms. Has hard time sleeping. Pain can be sharp at times but daily suffers from lower back pain. Exercises do help.    Lumbar xray IMPRESSION: Mild convex right scoliosis.  Otherwise negative.    Past Medical History:  Diagnosis Date  . Chronic cholecystitis   . GERD (gastroesophageal reflux disease)    Tums prn, worse in pregnancy  . HSV infection    Has never had an outbreak on valtrex, fever blister  . Miscarriage   . Preglaucoma, unspecified    elevated IOP/  right eye  . Unspecified iridocyclitis    right eye  . Uveitis    Past Surgical History:  Procedure Laterality Date  . ANAL FISSURE REPAIR  AGE 32  . CATARACT EXTRACTION W/ INTRAOCULAR LENS IMPLANT Right APRIL 2014  . CESAREAN SECTION N/A 04/22/2016   Procedure: CESAREAN SECTION;  Surgeon: Servando Salina, MD;  Location: [place];  Service: Obstetrics;  Laterality: N/A;  . CHOLECYSTECTOMY N/A 09/05/2017   Procedure: LAPAROSCOPIC CHOLECYSTECTOMY;  Surgeon: Coralie Keens, MD;  Location: [place];  Service: General;  Laterality: N/A;  . DILATION AND EVACUATION N/A  08/21/2013   miscarriage   Social History   Socioeconomic History  . Marital status: Married    Spouse name: Not on file  . Number of children: Not on file  . Years of education: Not on file  . Highest education level: Not on file  Occupational History  . Not on file  Tobacco Use  . Smoking status: Never Smoker  . Smokeless tobacco: Never Used  Substance and Sexual Activity  . Alcohol use: [name]  . Drug use: [name]  . Sexual activity: Yes    Birth control/protection: I.U.D.  Other Topics Concern  . Not on file  Social History Narrative   Married. 3 children- [name] (sounds like isla)  and  [name]- daughters. Youngest child born 2017      Studying for FNP- [employer] online- will do clinicals around the area though. Should be starting clinicals 2020.    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 Goes for walks with kids. Enjoys shopping/spending money.          Social Determinants of Health   Financial Resource Strain:   . Difficulty of Paying Living Expenses: Not on file  Food Insecurity:   . Worried About Charity fundraiser in the Last Year: Not on file  . Ran Out of Food in the Last Year: Not on file  Transportation Needs:   . Lack  of Transportation (Medical): Not on file  . Lack of Transportation (Non-Medical): Not on file  Physical Activity:   . Days of Exercise per Week: Not on file  . Minutes of Exercise per Session: Not on file  Stress:   . Feeling of Stress : Not on file  Social Connections:   . Frequency of Communication with Friends and Family: Not on file  . Frequency of Social Gatherings with Friends and Family: Not on file  . Attends Religious Services: Not on file  . Active Member of Clubs or Organizations: Not on file  . Attends Archivist Meetings: Not on file  . Marital Status: Not on file   [name] Known Allergies Family History  Problem Relation Age  of Onset  . Diabetes Mother   . Hypertension Mother   . Diabetes Father   . Hypertension Father   . Cancer Father        Colon 58, lung- former smoker 17  . Hyperlipidemia Father        mother        Current Outpatient Medications (Hematological):  .  folic acid (FOLVITE) 1 MG tablet, Take by mouth.  Current Outpatient Medications (Other):  Marland Kitchen  Black Pepper-Turmeric (TURMERIC CURCUMIN) 02-999 MG CAPS, Take by mouth. .  brimonidine-timolol (COMBIGAN) 0.2-0.5 % ophthalmic solution, Place 1 drop into the right eye every 12 hours. Marland Kitchen  loteprednol (LOTEMAX) 0.5 % ophthalmic suspension, Place 1 drop into the right eye in the morning and at bedtime. .  Omega-3 Fatty Acids (FISH OIL CONCENTRATE PO), Take by mouth. .  Red Yeast Rice Extract (RED YEAST RICE PO), Take by mouth. .  gabapentin (NEURONTIN) 100 MG capsule, Take 2 capsules (200 mg total) by mouth at bedtime.   Reviewed prior external information including notes and imaging from  primary care provider As well as notes that were available from care everywhere and other healthcare systems.  Past medical history, social, surgical and family history all reviewed in electronic medical record.  [name] pertanent information unless stated regarding to the chief complaint.   Review of Systems:  [name] headache, visual changes, nausea, vomiting, diarrhea, constipation, dizziness, abdominal pain, skin rash, fevers, chills, night sweats, weight loss, swollen lymph nodes, body aches, joint swelling, chest pain, shortness of breath, mood changes. POSITIVE muscle aches  Objective  Blood pressure 110/78, pulse 77, height 5\' 2"  (1.575 m), weight 158 lb (71.7 kg), SpO2 98 %, currently breastfeeding.   General: [name] apparent distress alert and oriented x3 mood and affect normal, dressed appropriately.  HEENT: Pupils equal, extraocular movements intact  Respiratory: Patient's speak in full sentences and does not appear short of breath  Cardiovascular: [name]  lower extremity edema, non tender, [name] erythema  Neuro: Cranial nerves II through XII are intact, neurovascularly intact in all extremities with 2+ DTRs and 2+ pulses.  Gait normal with good balance and coordination.  MSK:  Non tender with full range of motion and good stability and symmetric strength and tone of shoulders, elbows, wrist, hip, knee and ankles bilaterally.   Back exam shows actually patient does have some increased lordosis at the L5-S1 area.  Tender to palpation over the right sacroiliac joint, negative straight leg test, mild increase in discomfort though with extension of the back.  Patient does have mild tightness of Faber on the right compared to left.  [id number]; 15 additional minutes spent for Therapeutic exercises as stated in above notes.  This included exercises focusing on stretching, strengthening, with  significant focus on eccentric aspects.   Long term goals include an improvement in range of motion, strength, endurance as well as avoiding reinjury. Patient's frequency would include in 1-2 times a day, 3-5 times a week for a duration of 6-12 weeks.  Low back exercises that included:  Pelvic tilt/bracing instruction to focus on control of the pelvic girdle and lower abdominal muscles  Glute strengthening exercises, focusing on proper firing of the glutes without engaging the low back muscles Proper stretching techniques for maximum relief for the hamstrings, hip flexors, low back and some rotation where tolerated  Proper technique shown and discussed handout in great detail with ATC.  All questions were discussed and answered.      Impression and Recommendations:     The above documentation has been reviewed and is accurate and complete Lyndal Pulley, DO       Note: This dictation was prepared with Dragon dictation along with smaller phrase technology. Any transcriptional errors that result from this process are unintentional.

## 2020-07-22 DIAGNOSIS — M9906 Segmental and somatic dysfunction of lower extremity: Secondary | ICD-10-CM | POA: Diagnosis not present

## 2020-07-22 DIAGNOSIS — M9903 Segmental and somatic dysfunction of lumbar region: Secondary | ICD-10-CM | POA: Diagnosis not present

## 2020-07-22 DIAGNOSIS — M9908 Segmental and somatic dysfunction of rib cage: Secondary | ICD-10-CM | POA: Diagnosis not present

## 2020-07-22 DIAGNOSIS — M9904 Segmental and somatic dysfunction of sacral region: Secondary | ICD-10-CM | POA: Diagnosis not present

## 2020-07-22 DIAGNOSIS — M9905 Segmental and somatic dysfunction of pelvic region: Secondary | ICD-10-CM | POA: Diagnosis not present

## 2020-07-22 DIAGNOSIS — M9902 Segmental and somatic dysfunction of thoracic region: Secondary | ICD-10-CM | POA: Diagnosis not present

## 2020-08-01 MED FILL — FOLIC ACID 1 MG TABS: 1 | 90 days supply | Qty: 450 | Fill #3

## 2020-08-04 ENCOUNTER — Ambulatory Visit: Payer: 59 | Admitting: Family Medicine

## 2020-08-04 MED FILL — METHOTREXATE SODIUM 2.5 MG: 2.5 | 84 days supply | Qty: 96 | Fill #0

## 2020-08-04 NOTE — Progress Notes (Deleted)
[name] Phone: [phone] Subjective:    I'm seeing this patient by the request  of:  Marin Olp, MD  CC:   [id number]   06/26/2020 Low back pain, seems to be multifactorial, does have some muscle imbalances, patient has gained weight over the course of the last couple years she states. Discussed potential weight loss, core strengthening exercises, gabapentin given for nighttime relief, discussed rotating mattress, x-rays pending to further evaluate. Patient will increase activity as tolerated follow-up again in 4 to 6 weeks. Worsening symptoms consider the possibility of formal physical therapy as well as the possibility of osteopathic manipulation.   Update 08/04/2020 Renee [name] is a 49 y.o. female coming in with complaint of low back pain.       Past Medical History:  Diagnosis Date  . Chronic cholecystitis   . GERD (gastroesophageal reflux disease)    Tums prn, worse in pregnancy  . HSV infection    Has never had an outbreak on valtrex, fever blister  . Miscarriage   . Preglaucoma, unspecified    elevated IOP/  right eye  . Unspecified iridocyclitis    right eye  . Uveitis    Past Surgical History:  Procedure Laterality Date  . ANAL FISSURE REPAIR  AGE 67  . CATARACT EXTRACTION W/ INTRAOCULAR LENS IMPLANT Right APRIL 2014  . CESAREAN SECTION N/A 04/22/2016   Procedure: CESAREAN SECTION;  Surgeon: Servando Salina, MD;  Location: [place];  Service: Obstetrics;  Laterality: N/A;  . CHOLECYSTECTOMY N/A 09/05/2017   Procedure: LAPAROSCOPIC CHOLECYSTECTOMY;  Surgeon: Coralie Keens, MD;  Location: [place];  Service: General;  Laterality: N/A;  . DILATION AND EVACUATION N/A 08/21/2013   miscarriage   Social History   Socioeconomic History  . Marital status: Married    Spouse name: Not on file  . Number of children: Not on file  . Years of education:  Not on file  . Highest education level: Not on file  Occupational History  . Not on file  Tobacco Use  . Smoking status: Never Smoker  . Smokeless tobacco: Never Used  Substance and Sexual Activity  . Alcohol use: No  . Drug use: No  . Sexual activity: Yes    Birth control/protection: I.U.D.  Other Topics Concern  . Not on file  Social History Narrative   Married. 3 children- [name] (sounds like isla)  and  [name]- daughters. Youngest child born 2017      Studying for FNP- [employer] online- will do clinicals around the area though. Should be starting clinicals 2020.    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 Goes for walks with kids. Enjoys shopping/spending money.          Social Determinants of Health   Financial Resource Strain:   . Difficulty of Paying Living Expenses: Not on file  Food Insecurity:   . Worried About Charity fundraiser in the Last Year: Not on file  . Ran Out of Food in the Last Year: Not on file  Transportation Needs:   . Lack of Transportation (Medical): Not on file  . Lack of Transportation (Non-Medical): Not on file  Physical Activity:   . Days of Exercise per Week: Not on file  . Minutes of Exercise per Session: Not on  file  Stress:   . Feeling of Stress : Not on file  Social Connections:   . Frequency of Communication with Friends and Family: Not on file  . Frequency of Social Gatherings with Friends and Family: Not on file  . Attends Religious Services: Not on file  . Active Member of Clubs or Organizations: Not on file  . Attends Archivist Meetings: Not on file  . Marital Status: Not on file   No Known Allergies Family History  Problem Relation Age of Onset  . Diabetes Mother   . Hypertension Mother   . Diabetes Father   . Hypertension Father   . Cancer Father        Colon 54, lung- former smoker 58  . Hyperlipidemia Father          mother        Current Outpatient Medications (Hematological):  .  folic acid (FOLVITE) 1 MG tablet, Take by mouth.  Current Outpatient Medications (Other):  Marland Kitchen  Black Pepper-Turmeric (TURMERIC CURCUMIN) 02-999 MG CAPS, Take by mouth. .  brimonidine-timolol (COMBIGAN) 0.2-0.5 % ophthalmic solution, Place 1 drop into the right eye every 12 hours. .  gabapentin (NEURONTIN) 100 MG capsule, Take 2 capsules (200 mg total) by mouth at bedtime. Marland Kitchen  loteprednol (LOTEMAX) 0.5 % ophthalmic suspension, Place 1 drop into the right eye in the morning and at bedtime. .  Omega-3 Fatty Acids (FISH OIL CONCENTRATE PO), Take by mouth. .  Red Yeast Rice Extract (RED YEAST RICE PO), Take by mouth.   Reviewed prior external information including notes and imaging from  primary care provider As well as notes that were available from care everywhere and other healthcare systems.  Past medical history, social, surgical and family history all reviewed in electronic medical record.  No pertanent information unless stated regarding to the chief complaint.   Review of Systems:  No headache, visual changes, nausea, vomiting, diarrhea, constipation, dizziness, abdominal pain, skin rash, fevers, chills, night sweats, weight loss, swollen lymph nodes, body aches, joint swelling, chest pain, shortness of breath, mood changes. POSITIVE muscle aches  Objective  currently breastfeeding.   General: No apparent distress alert and oriented x3 mood and affect normal, dressed appropriately.  HEENT: Pupils equal, extraocular movements intact  Respiratory: Patient's speak in full sentences and does not appear short of breath  Cardiovascular: No lower extremity edema, non tender, no erythema  Neuro: Cranial nerves II through XII are intact, neurovascularly intact in all extremities with 2+ DTRs and 2+ pulses.  Gait normal with good balance and coordination.  MSK:  Non tender with full range of motion and good stability  and symmetric strength and tone of shoulders, elbows, wrist, hip, knee and ankles bilaterally.     Impression and Recommendations:     The above documentation has been reviewed and is accurate and complete Renee [name]

## 2020-08-05 DIAGNOSIS — M9904 Segmental and somatic dysfunction of sacral region: Secondary | ICD-10-CM | POA: Diagnosis not present

## 2020-08-05 DIAGNOSIS — M5451 Vertebrogenic low back pain: Secondary | ICD-10-CM | POA: Diagnosis not present

## 2020-08-05 DIAGNOSIS — M9901 Segmental and somatic dysfunction of cervical region: Secondary | ICD-10-CM | POA: Diagnosis not present

## 2020-08-05 DIAGNOSIS — M62830 Muscle spasm of back: Secondary | ICD-10-CM | POA: Diagnosis not present

## 2020-08-05 DIAGNOSIS — M9902 Segmental and somatic dysfunction of thoracic region: Secondary | ICD-10-CM | POA: Diagnosis not present

## 2020-08-05 DIAGNOSIS — M9905 Segmental and somatic dysfunction of pelvic region: Secondary | ICD-10-CM | POA: Diagnosis not present

## 2020-08-05 DIAGNOSIS — R293 Abnormal posture: Secondary | ICD-10-CM | POA: Diagnosis not present

## 2020-08-05 DIAGNOSIS — M9903 Segmental and somatic dysfunction of lumbar region: Secondary | ICD-10-CM | POA: Diagnosis not present

## 2020-08-18 DIAGNOSIS — M9908 Segmental and somatic dysfunction of rib cage: Secondary | ICD-10-CM | POA: Diagnosis not present

## 2020-08-18 DIAGNOSIS — R293 Abnormal posture: Secondary | ICD-10-CM | POA: Diagnosis not present

## 2020-08-18 DIAGNOSIS — M542 Cervicalgia: Secondary | ICD-10-CM | POA: Diagnosis not present

## 2020-08-18 DIAGNOSIS — M9901 Segmental and somatic dysfunction of cervical region: Secondary | ICD-10-CM | POA: Diagnosis not present

## 2020-08-18 DIAGNOSIS — M9902 Segmental and somatic dysfunction of thoracic region: Secondary | ICD-10-CM | POA: Diagnosis not present

## 2020-08-18 DIAGNOSIS — M62830 Muscle spasm of back: Secondary | ICD-10-CM | POA: Diagnosis not present

## 2020-09-05 ENCOUNTER — Other Ambulatory Visit (HOSPITAL_COMMUNITY): Payer: Self-pay | Admitting: Ophthalmology

## 2020-09-05 DIAGNOSIS — Z79899 Other long term (current) drug therapy: Secondary | ICD-10-CM | POA: Diagnosis not present

## 2020-09-05 DIAGNOSIS — Z961 Presence of intraocular lens: Secondary | ICD-10-CM | POA: Diagnosis not present

## 2020-09-05 DIAGNOSIS — H53002 Unspecified amblyopia, left eye: Secondary | ICD-10-CM | POA: Diagnosis not present

## 2020-09-05 DIAGNOSIS — H35372 Puckering of macula, left eye: Secondary | ICD-10-CM | POA: Diagnosis not present

## 2020-09-05 DIAGNOSIS — H15001 Unspecified scleritis, right eye: Secondary | ICD-10-CM | POA: Diagnosis not present

## 2020-09-05 DIAGNOSIS — H15101 Unspecified episcleritis, right eye: Secondary | ICD-10-CM | POA: Diagnosis not present

## 2020-09-05 DIAGNOSIS — H40051 Ocular hypertension, right eye: Secondary | ICD-10-CM | POA: Diagnosis not present

## 2020-09-17 DIAGNOSIS — M9908 Segmental and somatic dysfunction of rib cage: Secondary | ICD-10-CM | POA: Diagnosis not present

## 2020-09-17 DIAGNOSIS — M9901 Segmental and somatic dysfunction of cervical region: Secondary | ICD-10-CM | POA: Diagnosis not present

## 2020-09-17 DIAGNOSIS — R293 Abnormal posture: Secondary | ICD-10-CM | POA: Diagnosis not present

## 2020-09-17 DIAGNOSIS — M62830 Muscle spasm of back: Secondary | ICD-10-CM | POA: Diagnosis not present

## 2020-09-17 DIAGNOSIS — M9905 Segmental and somatic dysfunction of pelvic region: Secondary | ICD-10-CM | POA: Diagnosis not present

## 2020-09-17 DIAGNOSIS — M542 Cervicalgia: Secondary | ICD-10-CM | POA: Diagnosis not present

## 2020-09-17 DIAGNOSIS — M9902 Segmental and somatic dysfunction of thoracic region: Secondary | ICD-10-CM | POA: Diagnosis not present

## 2020-09-17 DIAGNOSIS — M9904 Segmental and somatic dysfunction of sacral region: Secondary | ICD-10-CM | POA: Diagnosis not present

## 2020-10-27 ENCOUNTER — Other Ambulatory Visit: Payer: Self-pay | Admitting: Physician Assistant

## 2020-10-27 ENCOUNTER — Encounter: Payer: Self-pay | Admitting: Physician Assistant

## 2020-10-27 DIAGNOSIS — Z79631 Long term (current) use of antimetabolite agent: Secondary | ICD-10-CM

## 2020-10-27 DIAGNOSIS — U071 COVID-19: Secondary | ICD-10-CM

## 2020-10-27 DIAGNOSIS — H15009 Unspecified scleritis, unspecified eye: Secondary | ICD-10-CM

## 2020-10-27 DIAGNOSIS — H209 Unspecified iridocyclitis: Secondary | ICD-10-CM

## 2020-10-27 DIAGNOSIS — Z79899 Other long term (current) drug therapy: Secondary | ICD-10-CM

## 2020-10-27 DIAGNOSIS — Z6831 Body mass index (BMI) 31.0-31.9, adult: Secondary | ICD-10-CM

## 2020-10-27 NOTE — Progress Notes (Signed)
I connected by phone with Renee [name] on 10/27/2020 at 11:46 AM to discuss the potential use of a new treatment for mild to moderate COVID-19 viral infection in non-hospitalized patients.  This patient is a 50 y.o. female that meets the FDA criteria for Emergency Use Authorization of COVID monoclonal antibody sotrovimab, casirivimab/imdevimab or bamlamivimab/estevimab.  Has a (+) direct SARS-CoV-2 viral test result  Has mild or moderate COVID-19   Is NOT hospitalized due to COVID-19  Is within 10 days of symptom onset  Has at least one of the high risk factor(s) for progression to severe COVID-19 and/or hospitalization as defined in EUA.  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 FDA has authorized the emergency use for the treatment of mild to moderate COVID-19 in adults and pediatric patients with positive results of direct SARS-CoV-2 viral testing who are 54 years of age and older weighing at least 40 kg, and who are at high risk for progressing to severe COVID-19 and/or hospitalization.  2. The significant known and potential risks and benefits of COVID monoclonal antibody, and the extent to which such potential risks and benefits are unknown.  3. Information on available alternative treatments and the risks and benefits of those alternatives, including clinical trials.  4. Patients treated with COVID monoclonal antibody should continue to self-isolate and use infection control measures (e.g., wear mask, isolate, social distance, avoid sharing personal items, clean and disinfect "high touch" surfaces, and frequent handwashing) according to CDC guidelines.   5. The patient or parent/caregiver has the option to accept or refuse COVID monoclonal antibody  treatment.  After reviewing this information with the patient, the patient has agreed to receive one of the available covid 19 monoclonal antibodies and will be provided an appropriate fact sheet prior to infusion.  Sx onset 1/8. Set up for infusion on 1/11 @ 1:30pm. Directions given to [facility]. Pt is aware that insurance will be charged an infusion fee. Pt is fully vaccinated but on methotrexate therapy.   [name] Form 10/27/2020 11:46 AM

## 2020-10-28 ENCOUNTER — Ambulatory Visit (HOSPITAL_COMMUNITY)
Admission: RE | Admit: 2020-10-28 | Discharge: 2020-10-28 | Disposition: A | Discharge status: Home / Self Care | Payer: 59 | Source: Ambulatory Visit | Attending: Pulmonary Disease | Admitting: Pulmonary Disease

## 2020-10-28 DIAGNOSIS — U071 COVID-19: Secondary | ICD-10-CM | POA: Insufficient documentation

## 2020-10-28 DIAGNOSIS — H15009 Unspecified scleritis, unspecified eye: Secondary | ICD-10-CM | POA: Diagnosis not present

## 2020-10-28 DIAGNOSIS — Z79899 Other long term (current) drug therapy: Secondary | ICD-10-CM | POA: Insufficient documentation

## 2020-10-28 DIAGNOSIS — H15109 Unspecified episcleritis, unspecified eye: Secondary | ICD-10-CM | POA: Insufficient documentation

## 2020-10-28 DIAGNOSIS — Z79631 Long term (current) use of antimetabolite agent: Secondary | ICD-10-CM

## 2020-10-28 DIAGNOSIS — H209 Unspecified iridocyclitis: Secondary | ICD-10-CM | POA: Insufficient documentation

## 2020-10-28 DIAGNOSIS — Z6831 Body mass index (BMI) 31.0-31.9, adult: Secondary | ICD-10-CM

## 2020-10-28 MED ORDER — SOTROVIMAB 500 MG/8ML IV SOLN
500.0000 mg | Freq: Once | INTRAVENOUS | Status: AC
  Administered 2020-10-28: 500 mg via INTRAVENOUS

## 2020-10-28 MED ORDER — SODIUM CHLORIDE 0.9 % IV SOLN: INTRAVENOUS | Status: DC | PRN

## 2020-10-28 MED ORDER — ALBUTEROL SULFATE HFA 108 (90 BASE) MCG/ACT IN AERS: 2.0000 | INHALATION_SPRAY | Freq: Once | RESPIRATORY_TRACT | Status: DC | PRN

## 2020-10-28 MED ORDER — EPINEPHRINE 0.3 MG/0.3ML IJ SOAJ: 0.3000 mg | Freq: Once | INTRAMUSCULAR | Status: DC | PRN

## 2020-10-28 MED ORDER — DIPHENHYDRAMINE HCL 50 MG/ML IJ SOLN: 50.0000 mg | Freq: Once | INTRAMUSCULAR | Status: DC | PRN

## 2020-10-28 MED ORDER — METHYLPREDNISOLONE SODIUM SUCC 125 MG IJ SOLR: 125.0000 mg | Freq: Once | INTRAMUSCULAR | Status: DC | PRN

## 2020-10-28 MED ORDER — FAMOTIDINE IN NACL 20-0.9 MG/50ML-% IV SOLN: 20.0000 mg | Freq: Once | INTRAVENOUS | Status: DC | PRN

## 2020-10-28 NOTE — Progress Notes (Signed)
Diagnosis: COVID-19  Physician: Dr. Patrick Wright  Procedure: [facility] Infusion [facility] Med: Sotrovimab infusion - Provided patient with sotrovimab fact sheet for patients, parents, and caregivers prior to infusion.   Complications: No immediate complications noted  Discharge: Discharged home    

## 2020-10-28 NOTE — Progress Notes (Signed)
Patient reviewed Fact Sheet for Patients, Parents, and Caregivers for Emergency Use Authorization (EUA) of sotrovimab for the Treatment of Coronavirus. Patient also reviewed and is agreeable to the estimated cost of treatment. Patient is agreeable to proceed.   

## 2020-10-28 NOTE — Discharge Instructions (Signed)
10 Things You Can Do to Manage Your COVID-19 Symptoms at Home If you have possible or confirmed COVID-19: 1. Stay home except to get medical care. 2. Monitor your symptoms carefully. If your symptoms get worse, call your healthcare provider immediately. 3. Get rest and stay hydrated. 4. If you have a medical appointment, call the healthcare provider ahead of time and tell them that you have or may have COVID-19. 5. For medical emergencies, call 911 and notify the dispatch personnel that you have or may have COVID-19. 6. Cover your cough and sneezes with a tissue or use the inside of your elbow. 7. Wash your hands often with soap and water for at least 20 seconds or clean your hands with an alcohol-based hand sanitizer that contains at least 60% alcohol. 8. As much as possible, stay in a specific room and away from other people in your home. Also, you should use a separate bathroom, if available. If you need to be around other people in or outside of the home, wear a mask. 9. Avoid sharing personal items with other people in your household, like dishes, towels, and bedding. 10. Clean all surfaces that are touched often, like counters, tabletops, and doorknobs. Use household cleaning sprays or wipes according to the label instructions. cdc.gov/coronavirus 05/02/2020 This information is not intended to replace advice given to you by your health care provider. Make sure you discuss any questions you have with your health care provider. Document Revised: 08/18/2020 Document Reviewed: 08/18/2020 Elsevier Patient Education  2021 Elsevier Inc.  What types of side effects do monoclonal antibody drugs cause?  Common side effects  In general, the more common side effects caused by monoclonal antibody drugs include: . Allergic reactions, such as hives or itching . Flu-like signs and symptoms, including chills, fatigue, fever, and muscle aches and pains . Nausea, vomiting . Diarrhea . Skin  rashes . Low blood pressure   The CDC is recommending patients who receive monoclonal antibody treatments wait at least 90 days before being vaccinated.  Currently, there are no data on the safety and efficacy of mRNA COVID-19 vaccines in persons who received monoclonal antibodies or convalescent plasma as part of COVID-19 treatment. Based on the estimated half-life of such therapies as well as evidence suggesting that reinfection is uncommon in the 90 days after initial infection, vaccination should be deferred for at least 90 days, as a precautionary measure until additional information becomes available, to avoid interference of the antibody treatment with vaccine-induced immune responses.  If you have any questions or concerns after the infusion please call the Advanced Practice Provider on call at [phone]. This number is ONLY intended for your use regarding questions or concerns about the infusion post-treatment side-effects.  Please do not provide this number to others for use. For return to work notes please contact your primary care provider.   If someone you know is interested in receiving treatment please have them call the COVID hotline at [phone].    

## 2020-10-31 ENCOUNTER — Other Ambulatory Visit (HOSPITAL_COMMUNITY): Payer: Self-pay | Admitting: Internal Medicine

## 2020-10-31 MED FILL — predniSONE 10 MG TABS: 10 | 10 days supply | Qty: 30 | Fill #0

## 2020-11-14 ENCOUNTER — Other Ambulatory Visit (HOSPITAL_COMMUNITY): Payer: Self-pay | Admitting: Internal Medicine

## 2020-11-14 MED FILL — COMBIGAN EYE DROPS: 0.2-0.5 | 30 days supply | Qty: 5 | Fill #1

## 2020-11-14 MED FILL — LOTEPREDNOL ETABONATE 0.5 %: 0.5 | 12 days supply | Qty: 5 | Fill #0

## 2020-11-14 MED FILL — FOLIC ACID 1 MG TABS: 1 | 90 days supply | Qty: 450 | Fill #0

## 2020-11-14 MED FILL — METHOTREXATE SODIUM 2.5 MG: 2.5 | 84 days supply | Qty: 96 | Fill #0

## 2020-11-17 ENCOUNTER — Other Ambulatory Visit (HOSPITAL_COMMUNITY): Payer: Self-pay | Admitting: Ophthalmology

## 2020-11-21 DIAGNOSIS — R293 Abnormal posture: Secondary | ICD-10-CM | POA: Diagnosis not present

## 2020-11-21 DIAGNOSIS — M62830 Muscle spasm of back: Secondary | ICD-10-CM | POA: Diagnosis not present

## 2020-11-21 DIAGNOSIS — M9904 Segmental and somatic dysfunction of sacral region: Secondary | ICD-10-CM | POA: Diagnosis not present

## 2020-11-21 DIAGNOSIS — M9905 Segmental and somatic dysfunction of pelvic region: Secondary | ICD-10-CM | POA: Diagnosis not present

## 2020-11-21 DIAGNOSIS — M9901 Segmental and somatic dysfunction of cervical region: Secondary | ICD-10-CM | POA: Diagnosis not present

## 2020-11-21 DIAGNOSIS — M9908 Segmental and somatic dysfunction of rib cage: Secondary | ICD-10-CM | POA: Diagnosis not present

## 2020-11-21 DIAGNOSIS — M542 Cervicalgia: Secondary | ICD-10-CM | POA: Diagnosis not present

## 2020-11-21 DIAGNOSIS — M9902 Segmental and somatic dysfunction of thoracic region: Secondary | ICD-10-CM | POA: Diagnosis not present

## 2020-12-02 ENCOUNTER — Other Ambulatory Visit (HOSPITAL_COMMUNITY): Payer: Self-pay | Admitting: Obstetrics

## 2020-12-02 DIAGNOSIS — Z1231 Encounter for screening mammogram for malignant neoplasm of breast: Secondary | ICD-10-CM | POA: Diagnosis not present

## 2020-12-02 DIAGNOSIS — Z6832 Body mass index (BMI) 32.0-32.9, adult: Secondary | ICD-10-CM | POA: Diagnosis not present

## 2020-12-02 DIAGNOSIS — A609 Anogenital herpesviral infection, unspecified: Secondary | ICD-10-CM | POA: Diagnosis not present

## 2020-12-02 DIAGNOSIS — Z01419 Encounter for gynecological examination (general) (routine) without abnormal findings: Secondary | ICD-10-CM | POA: Diagnosis not present

## 2020-12-02 LAB — HM MAMMOGRAPHY

## 2020-12-02 MED FILL — VALACYCLOVIR HCL 500 MG TAB: 500 | 15 days supply | Qty: 30 | Fill #0

## 2020-12-09 ENCOUNTER — Encounter: Payer: Self-pay | Admitting: Family Medicine

## 2020-12-11 DIAGNOSIS — M9904 Segmental and somatic dysfunction of sacral region: Secondary | ICD-10-CM | POA: Diagnosis not present

## 2020-12-11 DIAGNOSIS — M545 Low back pain, unspecified: Secondary | ICD-10-CM | POA: Diagnosis not present

## 2020-12-11 DIAGNOSIS — M542 Cervicalgia: Secondary | ICD-10-CM | POA: Diagnosis not present

## 2020-12-11 DIAGNOSIS — M9905 Segmental and somatic dysfunction of pelvic region: Secondary | ICD-10-CM | POA: Diagnosis not present

## 2020-12-11 DIAGNOSIS — M62830 Muscle spasm of back: Secondary | ICD-10-CM | POA: Diagnosis not present

## 2020-12-11 DIAGNOSIS — M9906 Segmental and somatic dysfunction of lower extremity: Secondary | ICD-10-CM | POA: Diagnosis not present

## 2020-12-11 DIAGNOSIS — R293 Abnormal posture: Secondary | ICD-10-CM | POA: Diagnosis not present

## 2020-12-11 DIAGNOSIS — M9902 Segmental and somatic dysfunction of thoracic region: Secondary | ICD-10-CM | POA: Diagnosis not present

## 2020-12-11 DIAGNOSIS — M9908 Segmental and somatic dysfunction of rib cage: Secondary | ICD-10-CM | POA: Diagnosis not present

## 2020-12-16 DIAGNOSIS — Z13220 Encounter for screening for lipoid disorders: Secondary | ICD-10-CM | POA: Diagnosis not present

## 2020-12-16 DIAGNOSIS — Z0000 Encounter for general adult medical examination without abnormal findings: Secondary | ICD-10-CM | POA: Diagnosis not present

## 2020-12-16 DIAGNOSIS — Z131 Encounter for screening for diabetes mellitus: Secondary | ICD-10-CM | POA: Diagnosis not present

## 2020-12-16 DIAGNOSIS — Z1329 Encounter for screening for other suspected endocrine disorder: Secondary | ICD-10-CM | POA: Diagnosis not present

## 2020-12-23 ENCOUNTER — Other Ambulatory Visit (HOSPITAL_COMMUNITY): Payer: Self-pay | Admitting: Ophthalmology

## 2020-12-23 DIAGNOSIS — Z961 Presence of intraocular lens: Secondary | ICD-10-CM | POA: Diagnosis not present

## 2020-12-23 DIAGNOSIS — H40051 Ocular hypertension, right eye: Secondary | ICD-10-CM | POA: Diagnosis not present

## 2020-12-23 DIAGNOSIS — Z79899 Other long term (current) drug therapy: Secondary | ICD-10-CM | POA: Diagnosis not present

## 2020-12-23 DIAGNOSIS — H53002 Unspecified amblyopia, left eye: Secondary | ICD-10-CM | POA: Diagnosis not present

## 2020-12-23 DIAGNOSIS — H15001 Unspecified scleritis, right eye: Secondary | ICD-10-CM | POA: Diagnosis not present

## 2020-12-23 DIAGNOSIS — H35372 Puckering of macula, left eye: Secondary | ICD-10-CM | POA: Diagnosis not present

## 2020-12-23 DIAGNOSIS — H15101 Unspecified episcleritis, right eye: Secondary | ICD-10-CM | POA: Diagnosis not present

## 2021-01-13 DIAGNOSIS — M545 Low back pain, unspecified: Secondary | ICD-10-CM | POA: Diagnosis not present

## 2021-01-13 DIAGNOSIS — M25552 Pain in left hip: Secondary | ICD-10-CM | POA: Diagnosis not present

## 2021-01-13 DIAGNOSIS — M9906 Segmental and somatic dysfunction of lower extremity: Secondary | ICD-10-CM | POA: Diagnosis not present

## 2021-01-13 DIAGNOSIS — M9902 Segmental and somatic dysfunction of thoracic region: Secondary | ICD-10-CM | POA: Diagnosis not present

## 2021-01-13 DIAGNOSIS — M9903 Segmental and somatic dysfunction of lumbar region: Secondary | ICD-10-CM | POA: Diagnosis not present

## 2021-01-13 DIAGNOSIS — M9904 Segmental and somatic dysfunction of sacral region: Secondary | ICD-10-CM | POA: Diagnosis not present

## 2021-01-13 DIAGNOSIS — M9905 Segmental and somatic dysfunction of pelvic region: Secondary | ICD-10-CM | POA: Diagnosis not present

## 2021-01-22 ENCOUNTER — Other Ambulatory Visit (HOSPITAL_COMMUNITY): Payer: Self-pay

## 2021-01-22 MED ORDER — PANTOPRAZOLE SODIUM 40 MG PO TBEC
40.0000 mg | DELAYED_RELEASE_TABLET | Freq: Every day | ORAL | 1 refills | Status: DC
  Filled 2021-01-22: qty 90, 90d supply, fill #0

## 2021-01-27 ENCOUNTER — Other Ambulatory Visit (HOSPITAL_COMMUNITY): Payer: Self-pay

## 2021-01-27 MED FILL — Methotrexate Sodium Tab 2.5 MG (Base Equiv): ORAL | 84 days supply | Qty: 96 | Fill #0 | Status: CN

## 2021-01-27 MED FILL — Folic Acid Tab 1 MG: ORAL | 90 days supply | Qty: 450 | Fill #0 | Status: CN

## 2021-02-05 ENCOUNTER — Other Ambulatory Visit (HOSPITAL_COMMUNITY): Payer: Self-pay

## 2021-02-18 ENCOUNTER — Other Ambulatory Visit (HOSPITAL_COMMUNITY): Payer: Self-pay

## 2021-02-18 MED FILL — Methotrexate Sodium Tab 2.5 MG (Base Equiv): ORAL | 84 days supply | Qty: 96 | Fill #0 | Status: AC

## 2021-02-20 ENCOUNTER — Other Ambulatory Visit (HOSPITAL_COMMUNITY): Payer: Self-pay

## 2021-03-12 ENCOUNTER — Other Ambulatory Visit (HOSPITAL_COMMUNITY): Payer: Self-pay

## 2021-03-12 MED FILL — Loteprednol Etabonate Ophth Susp 0.5%: OPHTHALMIC | 12 days supply | Qty: 5 | Fill #0 | Status: AC

## 2021-03-17 ENCOUNTER — Other Ambulatory Visit (HOSPITAL_COMMUNITY): Payer: Self-pay

## 2021-03-17 DIAGNOSIS — H40051 Ocular hypertension, right eye: Secondary | ICD-10-CM | POA: Diagnosis not present

## 2021-03-17 DIAGNOSIS — Z79899 Other long term (current) drug therapy: Secondary | ICD-10-CM | POA: Diagnosis not present

## 2021-03-17 DIAGNOSIS — H15101 Unspecified episcleritis, right eye: Secondary | ICD-10-CM | POA: Diagnosis not present

## 2021-03-17 DIAGNOSIS — H53002 Unspecified amblyopia, left eye: Secondary | ICD-10-CM | POA: Diagnosis not present

## 2021-03-17 DIAGNOSIS — Z961 Presence of intraocular lens: Secondary | ICD-10-CM | POA: Diagnosis not present

## 2021-03-17 DIAGNOSIS — H35372 Puckering of macula, left eye: Secondary | ICD-10-CM | POA: Diagnosis not present

## 2021-03-17 DIAGNOSIS — H15001 Unspecified scleritis, right eye: Secondary | ICD-10-CM | POA: Diagnosis not present

## 2021-03-17 MED ORDER — METHOTREXATE 2.5 MG PO TABS
20.0000 mg | ORAL_TABLET | ORAL | 5 refills | Status: DC
  Filled 2021-03-17: qty 96, 84d supply, fill #0

## 2021-03-17 MED ORDER — FOLIC ACID 1 MG PO TABS
5.0000 mg | ORAL_TABLET | Freq: Every day | ORAL | 5 refills | Status: DC
  Filled 2021-03-17: qty 450, 90d supply, fill #0
  Filled 2021-08-03: qty 450, 90d supply, fill #1

## 2021-03-18 ENCOUNTER — Other Ambulatory Visit (HOSPITAL_COMMUNITY): Payer: Self-pay

## 2021-03-18 MED ORDER — SULFAMETHOXAZOLE-TRIMETHOPRIM 800-160 MG PO TABS
1.0000 | ORAL_TABLET | Freq: Two times a day (BID) | ORAL | 0 refills | Status: DC
  Filled 2021-03-18: qty 20, 10d supply, fill #0

## 2021-03-19 ENCOUNTER — Other Ambulatory Visit (HOSPITAL_COMMUNITY): Payer: Self-pay

## 2021-05-27 ENCOUNTER — Other Ambulatory Visit (HOSPITAL_COMMUNITY): Payer: Self-pay

## 2021-05-27 MED FILL — Methotrexate Sodium Tab 2.5 MG (Base Equiv): ORAL | 84 days supply | Qty: 96 | Fill #1 | Status: AC

## 2021-06-02 DIAGNOSIS — H15001 Unspecified scleritis, right eye: Secondary | ICD-10-CM | POA: Diagnosis not present

## 2021-06-02 DIAGNOSIS — H15101 Unspecified episcleritis, right eye: Secondary | ICD-10-CM | POA: Diagnosis not present

## 2021-06-02 DIAGNOSIS — Z79899 Other long term (current) drug therapy: Secondary | ICD-10-CM | POA: Diagnosis not present

## 2021-06-09 ENCOUNTER — Other Ambulatory Visit (HOSPITAL_COMMUNITY): Payer: Self-pay

## 2021-06-09 DIAGNOSIS — Z79899 Other long term (current) drug therapy: Secondary | ICD-10-CM | POA: Diagnosis not present

## 2021-06-09 DIAGNOSIS — H53002 Unspecified amblyopia, left eye: Secondary | ICD-10-CM | POA: Diagnosis not present

## 2021-06-09 DIAGNOSIS — H40051 Ocular hypertension, right eye: Secondary | ICD-10-CM | POA: Diagnosis not present

## 2021-06-09 DIAGNOSIS — Z961 Presence of intraocular lens: Secondary | ICD-10-CM | POA: Diagnosis not present

## 2021-06-09 DIAGNOSIS — H15101 Unspecified episcleritis, right eye: Secondary | ICD-10-CM | POA: Diagnosis not present

## 2021-06-09 DIAGNOSIS — H15001 Unspecified scleritis, right eye: Secondary | ICD-10-CM | POA: Diagnosis not present

## 2021-06-09 DIAGNOSIS — H35372 Puckering of macula, left eye: Secondary | ICD-10-CM | POA: Diagnosis not present

## 2021-06-09 MED ORDER — METHOTREXATE 2.5 MG PO TABS
25.0000 mg | ORAL_TABLET | ORAL | 5 refills | Status: DC
  Filled 2021-06-09: qty 120, 84d supply, fill #0

## 2021-06-09 MED ORDER — LOTEPREDNOL ETABONATE 0.5 % OP SUSP
1.0000 [drp] | Freq: Four times a day (QID) | OPHTHALMIC | 11 refills | Status: DC
  Filled 2021-06-09: qty 15, 38d supply, fill #0

## 2021-06-09 MED ORDER — FOLIC ACID 1 MG PO TABS
5.0000 mg | ORAL_TABLET | Freq: Every day | ORAL | 5 refills | Status: DC
  Filled 2021-06-09: qty 450, 90d supply, fill #0

## 2021-06-10 ENCOUNTER — Other Ambulatory Visit (HOSPITAL_COMMUNITY): Payer: Self-pay

## 2021-06-18 ENCOUNTER — Other Ambulatory Visit (HOSPITAL_COMMUNITY): Payer: Self-pay

## 2021-07-28 ENCOUNTER — Other Ambulatory Visit: Payer: Self-pay

## 2021-07-28 ENCOUNTER — Ambulatory Visit: Payer: 59 | Admitting: Family

## 2021-07-28 ENCOUNTER — Encounter: Payer: Self-pay | Admitting: Family

## 2021-07-28 DIAGNOSIS — R5383 Other fatigue: Secondary | ICD-10-CM | POA: Diagnosis not present

## 2021-07-28 DIAGNOSIS — R6889 Other general symptoms and signs: Secondary | ICD-10-CM | POA: Insufficient documentation

## 2021-07-28 DIAGNOSIS — B9789 Other viral agents as the cause of diseases classified elsewhere: Secondary | ICD-10-CM | POA: Diagnosis not present

## 2021-07-28 DIAGNOSIS — J028 Acute pharyngitis due to other specified organisms: Secondary | ICD-10-CM | POA: Diagnosis not present

## 2021-07-28 LAB — COMPREHENSIVE METABOLIC PANEL
ALT: 19 U/L (ref 0–35)
AST: 21 U/L (ref 0–37)
Albumin: 4.5 g/dL (ref 3.5–5.2)
Alkaline Phosphatase: 40 U/L (ref 39–117)
BUN: 18 mg/dL (ref 6–23)
CO2: 30 mEq/L (ref 19–32)
Calcium: 9.7 mg/dL (ref 8.4–10.5)
Chloride: 102 mEq/L (ref 96–112)
Creatinine, Ser: 0.67 mg/dL (ref 0.40–1.20)
GFR: 103.43 mL/min (ref >60.00)
Glucose, Bld: 84 mg/dL (ref 70–99)
Potassium: 3.9 mEq/L (ref 3.5–5.1)
Sodium: 139 mEq/L (ref 135–145)
Total Bilirubin: 0.5 mg/dL (ref 0.2–1.2)
Total Protein: 7.4 g/dL (ref 6.0–8.3)

## 2021-07-28 LAB — CBC WITH DIFFERENTIAL/PLATELET
Basophils Absolute: 0 10*3/uL (ref 0.0–0.1)
Basophils Relative: 0.7 % (ref 0.0–3.0)
Eosinophils Absolute: 0.2 10*3/uL (ref 0.0–0.7)
Eosinophils Relative: 3.8 % (ref 0.0–5.0)
HCT: 39.5 % (ref 36.0–46.0)
Hemoglobin: 13.2 g/dL (ref 12.0–15.0)
Lymphocytes Relative: 25.9 % (ref 12.0–46.0)
Lymphs Abs: 1.1 10*3/uL (ref 0.7–4.0)
MCHC: 33.4 g/dL (ref 30.0–36.0)
MCV: 92 fl (ref 78.0–100.0)
Monocytes Absolute: 0.4 10*3/uL (ref 0.1–1.0)
Monocytes Relative: 8.8 % (ref 3.0–12.0)
Neutro Abs: 2.7 10*3/uL (ref 1.4–7.7)
Neutrophils Relative %: 60.8 % (ref 43.0–77.0)
Platelets: 230 10*3/uL (ref 150.0–400.0)
RBC: 4.29 Mil/uL (ref 3.87–5.11)
RDW: 13.9 % (ref 11.5–15.5)
WBC: 4.4 10*3/uL (ref 4.0–10.5)

## 2021-07-28 LAB — POCT INFLUENZA A/B
Influenza A, POC: NEGATIVE
Influenza B, POC: NEGATIVE

## 2021-07-28 LAB — TSH: TSH: 1.63 u[IU]/mL (ref 0.35–5.50)

## 2021-07-28 LAB — POCT RAPID STREP A (OFFICE): Rapid Strep A Screen: NEGATIVE

## 2021-07-28 LAB — T4, FREE: Free T4: 0.81 ng/dL (ref 0.60–1.60)

## 2021-07-28 NOTE — Patient Instructions (Signed)
Go to the lab today, we will call you with these results and your strep culture result. Continue OTC meds for current symptoms. Drink at least 64oz water daily. Call the office if symptoms worsen or do not improve.

## 2021-07-28 NOTE — Progress Notes (Signed)
Acute Office Visit  Subjective:    Patient ID: Renee [name], female    DOB: Feb 24, 1971, 50 y.o.   MRN: [id number]  Chief Complaint  Patient presents with   Sore Throat    Pt says that her daughter has Strep Throat. Her symptoms began 2 days ago.    Nasal Congestion   Fatigue   Cough    HPI Patient is in today for sore throat, fatigue, nasal congestion, and mild cough. Denies fever or drainage. Child tested positive for Strep. Pt has not received flu vaccine this year.  Past Medical History:  Diagnosis Date   Chronic cholecystitis    GERD (gastroesophageal reflux disease)    Tums prn, worse in pregnancy   HSV infection    Has never had an outbreak on valtrex, fever blister   Miscarriage    Preglaucoma, unspecified    elevated IOP/  right eye   Scleritis    on methotrexate   Unspecified iridocyclitis    right eye   Uveitis        Outpatient Medications Prior to Visit  Medication Sig Dispense Refill   Black Pepper-Turmeric (TURMERIC CURCUMIN) 02-999 MG CAPS Take by mouth.     brimonidine-timolol (COMBIGAN) 0.2-0.5 % ophthalmic solution Place 1 drop into the right eye every 12 hours.     folic acid (FOLVITE) 1 MG tablet Take by mouth.     folic acid (FOLVITE) 1 MG tablet Take 5 tablets (5 mg total) by mouth daily. 868 tablet 5   folic acid (FOLVITE) 1 MG tablet Take 5 tablets (5 mg total) by mouth daily. 450 tablet 5   gabapentin (NEURONTIN) 100 MG capsule Take 2 capsules (200 mg total) by mouth at bedtime. 180 capsule 0   loteprednol (LOTEMAX) 0.5 % ophthalmic suspension Place 1 drop into the right eye in the morning and at bedtime.     loteprednol (LOTEMAX) 0.5 % ophthalmic suspension INSTILL 1 DROP INTO THE RIGHT EYE EVERY MORNING AND AT BEDTIME 5 mL 3   loteprednol (LOTEMAX) 0.5 % ophthalmic suspension INSTILL 2 DROPS IN RIGHT EYE 4 TIMES A DAY 5 mL 1   loteprednol (LOTEMAX) 0.5 % ophthalmic suspension Place 1 drop into both eyes 4 (four) times daily. 15 mL 11    methotrexate (RHEUMATREX) 2.5 MG tablet TAKE 8 TABLETS (20 MG TOTAL) BY MOUTH ONCE A WEEK. 96 tablet 5   methotrexate (RHEUMATREX) 2.5 MG tablet Take 8 tablets (20 mg total) by mouth once a week. 96 tablet 5   methotrexate (RHEUMATREX) 2.5 MG tablet Take 10 tablets (25 mg total) by mouth once a week. 120 tablet 5   methotrexate 2.5 MG tablet TAKE 8 TABLETS (20 MG TOTAL) BY MOUTH ONCE A WEEK. 96 tablet 5   methotrexate 2.5 MG tablet TAKE 8 TABLETS (20 MG TOTAL) BY MOUTH ONCE A WEEK. 96 tablet 5   Omega-3 Fatty Acids (FISH OIL CONCENTRATE PO) Take by mouth.     pantoprazole (PROTONIX) 40 MG tablet Take 1 tablet (40 mg total) by mouth daily. 90 tablet 1   Red Yeast Rice Extract (RED YEAST RICE PO) Take by mouth.     valACYclovir (VALTREX) 500 MG tablet TAKE 1 TABLET BY MOUTH 2 TIMES A DAY FOR 3 DAYS WITH SYMPTOMS 30 tablet 1   predniSONE (DELTASONE) 10 MG tablet TAKE 5 TABS BY MOUTH FOR 2 DAYS, 4 TABS FOR 2 DAYS, 3 TABS FOR 2 DAYS, 2 TABS FOR 2 DAYS,1TAB FOR 2 DAYS THE STOP (Patient  not taking: Reported on 07/28/2021) 30 tablet 0   sulfamethoxazole-trimethoprim (BACTRIM DS) 800-160 MG tablet Take 1 tablet by mouth 2 (two) times daily. (Patient not taking: Reported on 07/28/2021) 20 tablet 0   No facility-administered medications prior to visit.    No Known Allergies  Review of Systems See HPI above.    Objective:    Physical Exam Vitals and nursing note reviewed.  Constitutional:      Appearance: Normal appearance.  HENT:     Right Ear: Tympanic membrane and external ear normal.     Left Ear: Tympanic membrane and external ear normal.     Mouth/Throat:     Mouth: Mucous membranes are moist.     Pharynx: Posterior oropharyngeal erythema present. No pharyngeal swelling, oropharyngeal exudate or uvula swelling.     Tonsils: No tonsillar exudate or tonsillar abscesses.  Cardiovascular:     Rate and Rhythm: Normal rate and regular rhythm.  Pulmonary:     Effort: Pulmonary effort is  normal.     Breath sounds: Normal breath sounds.  Musculoskeletal:        General: Normal range of motion.  Skin:    General: Skin is warm and dry.  Neurological:     Mental Status: She is alert.  Psychiatric:        Mood and Affect: Mood normal.        Behavior: Behavior normal.    BP 106/72   Pulse 74   Temp 98.1 F (36.7 C) (Temporal)   Ht _0  (1.575 m)   Wt 168 lb 3.2 oz (76.3 kg)   SpO2 98%   BMI 30.76 kg/m  Wt Readings from Last 3 Encounters:  07/28/21 168 lb 3.2 oz (76.3 kg)  06/26/20 158 lb (71.7 kg)  10/30/19 158 lb (71.7 kg)    Health Maintenance Due  Topic Date Due   Hepatitis C Screening  Never done   COVID-19 Vaccine (2 - Pfizer risk series) 12/04/2019   PAP SMEAR-Modifier  11/23/2020    There are no preventive care reminders to display for this patient.       Assessment & Plan:   Problem List Items Addressed This Visit       Respiratory   Sore throat (viral)    Rapid strep negative, sending for culture.      Relevant Orders   POCT rapid strep A (Completed)   Culture, Group A Strep     Other   Flu-like symptoms    Rapid flu negative. Continue OTC meds, fluids, rest.      Relevant Orders   POCT Influenza A/B (Completed)   Fatigue    Rapid flu negative, reports mild fatigue on occasion, but this is more severe. Will check thyroid, CMP, and CBC.      Relevant Orders   CBC with Differential/Platelet   TSH   T4, free   Comp Met (CMET)     No orders of the defined types were placed in this encounter.    Jeanie Sewer, NP

## 2021-07-28 NOTE — Assessment & Plan Note (Signed)
Rapid strep negative, sending for culture.

## 2021-07-28 NOTE — Assessment & Plan Note (Signed)
Rapid flu negative. Continue OTC meds, fluids, rest.

## 2021-07-28 NOTE — Assessment & Plan Note (Addendum)
Rapid flu negative, reports mild fatigue on occasion, but this is more severe. Will check thyroid, CMP, and CBC.

## 2021-07-29 ENCOUNTER — Encounter: Payer: Self-pay | Admitting: Family

## 2021-07-30 LAB — CULTURE, GROUP A STREP
MICRO NUMBER:: 12487541
SPECIMEN QUALITY:: ADEQUATE

## 2021-07-31 ENCOUNTER — Telehealth: Payer: Self-pay

## 2021-07-31 NOTE — Telephone Encounter (Signed)
FYI:  Patient called in stating she has reviewed [facility]'s response to her labs through [place].   Patient states she understood.

## 2021-08-03 ENCOUNTER — Other Ambulatory Visit (HOSPITAL_COMMUNITY): Payer: Self-pay

## 2021-08-03 MED FILL — Methotrexate Sodium Tab 2.5 MG (Base Equiv): ORAL | 84 days supply | Qty: 96 | Fill #0 | Status: AC

## 2021-08-04 ENCOUNTER — Other Ambulatory Visit (HOSPITAL_COMMUNITY): Payer: Self-pay

## 2021-08-05 ENCOUNTER — Ambulatory Visit (INDEPENDENT_AMBULATORY_CARE_PROVIDER_SITE_OTHER): Payer: 59 | Admitting: *Deleted

## 2021-08-05 ENCOUNTER — Other Ambulatory Visit: Payer: Self-pay

## 2021-08-05 DIAGNOSIS — Z23 Encounter for immunization: Secondary | ICD-10-CM | POA: Diagnosis not present

## 2021-08-06 ENCOUNTER — Ambulatory Visit: Payer: 59

## 2021-08-26 DIAGNOSIS — H15101 Unspecified episcleritis, right eye: Secondary | ICD-10-CM | POA: Diagnosis not present

## 2021-08-26 DIAGNOSIS — H15001 Unspecified scleritis, right eye: Secondary | ICD-10-CM | POA: Diagnosis not present

## 2021-08-26 DIAGNOSIS — Z79899 Other long term (current) drug therapy: Secondary | ICD-10-CM | POA: Diagnosis not present

## 2021-09-01 ENCOUNTER — Other Ambulatory Visit (HOSPITAL_COMMUNITY): Payer: Self-pay

## 2021-09-01 DIAGNOSIS — Z79899 Other long term (current) drug therapy: Secondary | ICD-10-CM | POA: Diagnosis not present

## 2021-09-01 DIAGNOSIS — Z961 Presence of intraocular lens: Secondary | ICD-10-CM | POA: Diagnosis not present

## 2021-09-01 DIAGNOSIS — H53002 Unspecified amblyopia, left eye: Secondary | ICD-10-CM | POA: Diagnosis not present

## 2021-09-01 DIAGNOSIS — H15001 Unspecified scleritis, right eye: Secondary | ICD-10-CM | POA: Diagnosis not present

## 2021-09-01 DIAGNOSIS — H40051 Ocular hypertension, right eye: Secondary | ICD-10-CM | POA: Diagnosis not present

## 2021-09-01 DIAGNOSIS — H35372 Puckering of macula, left eye: Secondary | ICD-10-CM | POA: Diagnosis not present

## 2021-09-01 DIAGNOSIS — H15101 Unspecified episcleritis, right eye: Secondary | ICD-10-CM | POA: Diagnosis not present

## 2021-09-01 MED ORDER — FOLIC ACID 1 MG PO TABS
5.0000 mg | ORAL_TABLET | Freq: Every day | ORAL | 5 refills | Status: DC
  Filled 2021-09-01: qty 450, 90d supply, fill #0

## 2021-09-01 MED ORDER — METHOTREXATE 2.5 MG PO TABS
25.0000 mg | ORAL_TABLET | ORAL | 5 refills | Status: DC
  Filled 2021-09-01 – 2021-10-19 (×2): qty 120, 84d supply, fill #0

## 2021-09-21 DIAGNOSIS — H15001 Unspecified scleritis, right eye: Secondary | ICD-10-CM | POA: Diagnosis not present

## 2021-09-21 DIAGNOSIS — H15101 Unspecified episcleritis, right eye: Secondary | ICD-10-CM | POA: Diagnosis not present

## 2021-09-21 DIAGNOSIS — M549 Dorsalgia, unspecified: Secondary | ICD-10-CM | POA: Diagnosis not present

## 2021-09-21 DIAGNOSIS — M2550 Pain in unspecified joint: Secondary | ICD-10-CM | POA: Diagnosis not present

## 2021-09-21 DIAGNOSIS — Z79899 Other long term (current) drug therapy: Secondary | ICD-10-CM | POA: Diagnosis not present

## 2021-09-21 DIAGNOSIS — M79642 Pain in left hand: Secondary | ICD-10-CM | POA: Diagnosis not present

## 2021-09-21 DIAGNOSIS — M79641 Pain in right hand: Secondary | ICD-10-CM | POA: Diagnosis not present

## 2021-10-19 ENCOUNTER — Other Ambulatory Visit (HOSPITAL_COMMUNITY): Payer: Self-pay

## 2021-12-03 ENCOUNTER — Other Ambulatory Visit (HOSPITAL_COMMUNITY): Payer: Self-pay

## 2021-12-03 DIAGNOSIS — H40051 Ocular hypertension, right eye: Secondary | ICD-10-CM | POA: Diagnosis not present

## 2021-12-03 DIAGNOSIS — H15101 Unspecified episcleritis, right eye: Secondary | ICD-10-CM | POA: Diagnosis not present

## 2021-12-03 DIAGNOSIS — H35372 Puckering of macula, left eye: Secondary | ICD-10-CM | POA: Diagnosis not present

## 2021-12-03 DIAGNOSIS — Z961 Presence of intraocular lens: Secondary | ICD-10-CM | POA: Diagnosis not present

## 2021-12-03 DIAGNOSIS — H15001 Unspecified scleritis, right eye: Secondary | ICD-10-CM | POA: Diagnosis not present

## 2021-12-03 DIAGNOSIS — H53002 Unspecified amblyopia, left eye: Secondary | ICD-10-CM | POA: Diagnosis not present

## 2021-12-03 DIAGNOSIS — Z79899 Other long term (current) drug therapy: Secondary | ICD-10-CM | POA: Diagnosis not present

## 2021-12-03 MED ORDER — TACROLIMUS 1 MG PO CAPS
1.0000 mg | ORAL_CAPSULE | Freq: Two times a day (BID) | ORAL | 5 refills | Status: DC
  Filled 2021-12-03: qty 60, 30d supply, fill #0

## 2021-12-03 MED ORDER — PREDNISONE 10 MG PO TABS
ORAL_TABLET | ORAL | 0 refills | Status: AC
  Filled 2021-12-03: qty 42, 21d supply, fill #0

## 2021-12-03 MED ORDER — LOTEPREDNOL ETABONATE 0.5 % OP SUSP
1.0000 [drp] | Freq: Four times a day (QID) | OPHTHALMIC | 11 refills | Status: DC
  Filled 2021-12-03: qty 5, 25d supply, fill #0
  Filled 2021-12-10: qty 15, 75d supply, fill #0

## 2021-12-03 MED ORDER — FOLIC ACID 1 MG PO TABS
5.0000 mg | ORAL_TABLET | Freq: Every day | ORAL | 5 refills | Status: DC
  Filled 2021-12-03 – 2022-01-19 (×2): qty 450, 90d supply, fill #0

## 2021-12-03 MED ORDER — METHOTREXATE 2.5 MG PO TABS
25.0000 mg | ORAL_TABLET | ORAL | 5 refills | Status: DC
  Filled 2021-12-03 – 2022-01-11 (×2): qty 120, 84d supply, fill #0
  Filled 2022-03-31: qty 120, 84d supply, fill #1
  Filled 2022-06-16 (×2): qty 120, 84d supply, fill #2

## 2021-12-10 ENCOUNTER — Other Ambulatory Visit (HOSPITAL_COMMUNITY): Payer: Self-pay

## 2021-12-10 ENCOUNTER — Other Ambulatory Visit: Payer: Self-pay

## 2021-12-10 MED ORDER — VALACYCLOVIR HCL 500 MG PO TABS
500.0000 mg | ORAL_TABLET | Freq: Two times a day (BID) | ORAL | 0 refills | Status: DC
  Filled 2021-12-10: qty 30, 15d supply, fill #0

## 2021-12-11 ENCOUNTER — Other Ambulatory Visit (HOSPITAL_COMMUNITY): Payer: Self-pay

## 2021-12-22 ENCOUNTER — Other Ambulatory Visit (HOSPITAL_COMMUNITY): Payer: Self-pay

## 2021-12-22 DIAGNOSIS — Z1231 Encounter for screening mammogram for malignant neoplasm of breast: Secondary | ICD-10-CM | POA: Diagnosis not present

## 2021-12-22 DIAGNOSIS — Z01419 Encounter for gynecological examination (general) (routine) without abnormal findings: Secondary | ICD-10-CM | POA: Diagnosis not present

## 2021-12-22 LAB — HM MAMMOGRAPHY

## 2021-12-22 MED ORDER — CLOBETASOL PROPIONATE 0.05 % EX OINT
TOPICAL_OINTMENT | Freq: Two times a day (BID) | CUTANEOUS | 1 refills | Status: DC
Start: 2021-12-22 — End: 2023-07-12
  Filled 2021-12-22: qty 30, 15d supply, fill #0

## 2021-12-30 ENCOUNTER — Other Ambulatory Visit (HOSPITAL_COMMUNITY): Payer: Self-pay

## 2022-01-11 ENCOUNTER — Other Ambulatory Visit (HOSPITAL_COMMUNITY): Payer: Self-pay

## 2022-01-19 ENCOUNTER — Other Ambulatory Visit (HOSPITAL_COMMUNITY): Payer: Self-pay

## 2022-03-02 ENCOUNTER — Other Ambulatory Visit (HOSPITAL_COMMUNITY): Payer: Self-pay

## 2022-03-02 DIAGNOSIS — H35372 Puckering of macula, left eye: Secondary | ICD-10-CM | POA: Diagnosis not present

## 2022-03-02 DIAGNOSIS — H15001 Unspecified scleritis, right eye: Secondary | ICD-10-CM | POA: Diagnosis not present

## 2022-03-02 DIAGNOSIS — H53002 Unspecified amblyopia, left eye: Secondary | ICD-10-CM | POA: Diagnosis not present

## 2022-03-02 DIAGNOSIS — Z961 Presence of intraocular lens: Secondary | ICD-10-CM | POA: Diagnosis not present

## 2022-03-02 DIAGNOSIS — H40051 Ocular hypertension, right eye: Secondary | ICD-10-CM | POA: Diagnosis not present

## 2022-03-02 DIAGNOSIS — Z79899 Other long term (current) drug therapy: Secondary | ICD-10-CM | POA: Diagnosis not present

## 2022-03-02 DIAGNOSIS — H15101 Unspecified episcleritis, right eye: Secondary | ICD-10-CM | POA: Diagnosis not present

## 2022-03-02 MED ORDER — METHOTREXATE 2.5 MG PO TABS
25.0000 mg | ORAL_TABLET | ORAL | 5 refills | Status: DC
  Filled 2022-03-02: qty 120, 84d supply, fill #0

## 2022-03-02 MED ORDER — FOLIC ACID 1 MG PO TABS
5.0000 mg | ORAL_TABLET | Freq: Every day | ORAL | 5 refills | Status: DC
  Filled 2022-03-02: qty 450, 90d supply, fill #0

## 2022-03-16 ENCOUNTER — Other Ambulatory Visit (HOSPITAL_COMMUNITY): Payer: Self-pay

## 2022-03-17 ENCOUNTER — Other Ambulatory Visit (HOSPITAL_COMMUNITY): Payer: Self-pay

## 2022-03-17 ENCOUNTER — Telehealth: Payer: Self-pay | Admitting: Family Medicine

## 2022-03-17 DIAGNOSIS — H15001 Unspecified scleritis, right eye: Secondary | ICD-10-CM | POA: Diagnosis not present

## 2022-03-17 DIAGNOSIS — Z79899 Other long term (current) drug therapy: Secondary | ICD-10-CM | POA: Diagnosis not present

## 2022-03-17 DIAGNOSIS — H15101 Unspecified episcleritis, right eye: Secondary | ICD-10-CM | POA: Diagnosis not present

## 2022-03-17 MED ORDER — AMOXICILLIN-POT CLAVULANATE 875-125 MG PO TABS
1.0000 | ORAL_TABLET | Freq: Two times a day (BID) | ORAL | 0 refills | Status: DC
  Filled 2022-03-17: qty 14, 7d supply, fill #0

## 2022-03-17 MED ORDER — PANTOPRAZOLE SODIUM 40 MG PO TBEC
40.0000 mg | DELAYED_RELEASE_TABLET | Freq: Every day | ORAL | 3 refills | Status: DC
  Filled 2022-03-17: qty 90, 90d supply, fill #0

## 2022-03-17 MED ORDER — VALACYCLOVIR HCL 500 MG PO TABS
500.0000 mg | ORAL_TABLET | Freq: Two times a day (BID) | ORAL | 0 refills | Status: DC
  Filled 2022-03-17: qty 90, 45d supply, fill #0

## 2022-03-17 NOTE — Telephone Encounter (Signed)
Pt is requesting to have fasting labs due to going out of the country for 2 months. She is due to leave on 04/03/22. She has not been seen in office since 2021 and cannot come in for appt due to work schedule. Please advise

## 2022-03-18 NOTE — Telephone Encounter (Signed)
Pt will need to schedule a visit since it has been more than 2 years since she has been seen.

## 2022-03-24 ENCOUNTER — Encounter: Payer: Self-pay | Admitting: Family Medicine

## 2022-03-24 ENCOUNTER — Ambulatory Visit (INDEPENDENT_AMBULATORY_CARE_PROVIDER_SITE_OTHER): Payer: 59 | Admitting: Family Medicine

## 2022-03-24 VITALS — BP 108/80 | HR 72 | Temp 98.0°F | Ht 62.0 in | Wt 147.0 lb

## 2022-03-24 DIAGNOSIS — Z1159 Encounter for screening for other viral diseases: Secondary | ICD-10-CM

## 2022-03-24 DIAGNOSIS — Z0000 Encounter for general adult medical examination without abnormal findings: Secondary | ICD-10-CM

## 2022-03-24 DIAGNOSIS — E785 Hyperlipidemia, unspecified: Secondary | ICD-10-CM | POA: Diagnosis not present

## 2022-03-24 LAB — COMPREHENSIVE METABOLIC PANEL
ALT: 44 U/L — ABNORMAL HIGH (ref 0–35)
AST: 31 U/L (ref 0–37)
Albumin: 4.8 g/dL (ref 3.5–5.2)
Alkaline Phosphatase: 42 U/L (ref 39–117)
BUN: 17 mg/dL (ref 6–23)
CO2: 29 mEq/L (ref 19–32)
Calcium: 10 mg/dL (ref 8.4–10.5)
Chloride: 103 mEq/L (ref 96–112)
Creatinine, Ser: 0.69 mg/dL (ref 0.40–1.20)
GFR: 102.23 mL/min (ref >60.00)
Glucose, Bld: 90 mg/dL (ref 70–99)
Potassium: 4.3 mEq/L (ref 3.5–5.1)
Sodium: 140 mEq/L (ref 135–145)
Total Bilirubin: 0.9 mg/dL (ref 0.2–1.2)
Total Protein: 7.4 g/dL (ref 6.0–8.3)

## 2022-03-24 LAB — LIPID PANEL
Cholesterol: 278 mg/dL — ABNORMAL HIGH (ref 0–200)
HDL: 70.9 mg/dL (ref >39.00)
LDL Cholesterol: 187 mg/dL — ABNORMAL HIGH (ref 0–99)
NonHDL: 207.15
Total CHOL/HDL Ratio: 4
Triglycerides: 99 mg/dL (ref 0.0–149.0)
VLDL: 19.8 mg/dL (ref 0.0–40.0)

## 2022-03-24 LAB — CBC WITH DIFFERENTIAL/PLATELET
Basophils Absolute: 0 10*3/uL (ref 0.0–0.1)
Basophils Relative: 0.3 % (ref 0.0–3.0)
Eosinophils Absolute: 0.1 10*3/uL (ref 0.0–0.7)
Eosinophils Relative: 1.8 % (ref 0.0–5.0)
HCT: 40.1 % (ref 36.0–46.0)
Hemoglobin: 13.5 g/dL (ref 12.0–15.0)
Lymphocytes Relative: 36.9 % (ref 12.0–46.0)
Lymphs Abs: 1.9 10*3/uL (ref 0.7–4.0)
MCHC: 33.6 g/dL (ref 30.0–36.0)
MCV: 92.6 fl (ref 78.0–100.0)
Monocytes Absolute: 0.3 10*3/uL (ref 0.1–1.0)
Monocytes Relative: 5 % (ref 3.0–12.0)
Neutro Abs: 2.8 10*3/uL (ref 1.4–7.7)
Neutrophils Relative %: 56 % (ref 43.0–77.0)
Platelets: 246 10*3/uL (ref 150.0–400.0)
RBC: 4.32 Mil/uL (ref 3.87–5.11)
RDW: 13.4 % (ref 11.5–15.5)
WBC: 5.1 10*3/uL (ref 4.0–10.5)

## 2022-03-24 NOTE — Patient Instructions (Addendum)
Consider calling back for prevnar 20 - due to taking methotrexate  Please stop by lab before you go If you have mychart- we will send your results within 3 business days of [place] receiving them.  If you do not have mychart- we will call you about results within 5 business days of [place] receiving them.  *please also note that you will see labs on mychart as soon as they post. I will later go in and write notes on them- will say "notes from Dr. Yong Channel"   Recommended follow up: Return in about 1 year (around 03/25/2023) for physical or sooner if needed.Schedule b4 you leave. You can cancel visit on 09/29/22 before you leave

## 2022-03-24 NOTE — Progress Notes (Signed)
Phone [phone]   Subjective:  Patient presents today for their annual physical. Chief complaint-noted.   See problem oriented charting- Review of Systems  Constitutional:  Negative for chills and fever.  HENT:  Negative for congestion and sinus pain.   Eyes:  Positive for blurred vision (uveitis issues working with optho). Negative for double vision.  Respiratory:  Negative for cough and shortness of breath.   Cardiovascular:  Negative for chest pain and palpitations.  Gastrointestinal:  Negative for abdominal pain, blood in stool, constipation, diarrhea, heartburn, melena, nausea and vomiting.  Genitourinary:  Negative for dysuria and frequency.  Musculoskeletal:  Negative for back pain, myalgias and neck pain.  Skin:  Negative for itching and rash.  Neurological:  Negative for dizziness and headaches.       Random vertigo but not recently  Endo/Heme/Allergies:  Negative for polydipsia. Does not bruise/bleed easily.  Psychiatric/Behavioral:  Negative for depression and suicidal ideas.    The following were reviewed and entered/updated in epic: Past Medical History:  Diagnosis Date   Chronic cholecystitis    GERD (gastroesophageal reflux disease)    Tums prn, worse in pregnancy   HSV infection    Has never had an outbreak on valtrex, fever blister   Miscarriage    Preglaucoma, unspecified    elevated IOP/  right eye   Scleritis    on methotrexate   Unspecified iridocyclitis    right eye   Uveitis    Patient Active Problem List   Diagnosis Date Noted   Uveitis     Priority: High   Benign paroxysmal positional vertigo 06/26/2020    Priority: Medium    Low back pain 06/26/2020    Priority: Low   Immunization, BCG 10/30/2019    Priority: Low   Family history of colon cancer 08/21/2015    Priority: Low   GERD (gastroesophageal reflux disease)     Priority: Low   S/P cataract extraction 02/02/2013    Priority: Low   Past Surgical History:  Procedure Laterality  Date   ANAL FISSURE REPAIR  AGE 58   CATARACT EXTRACTION W/ INTRAOCULAR LENS IMPLANT Right APRIL 2014   CESAREAN SECTION N/A 04/22/2016   Procedure: CESAREAN SECTION;  Surgeon: Servando Salina, MD;  Location: [facility];  Service: Obstetrics;  Laterality: N/A;   CHOLECYSTECTOMY N/A 09/05/2017   Procedure: LAPAROSCOPIC CHOLECYSTECTOMY;  Surgeon: Coralie Keens, MD;  Location: [facility];  Service: General;  Laterality: N/A;   DILATION AND EVACUATION N/A 08/21/2013   miscarriage    Family History  Problem Relation Age of Onset   Diabetes Mother    Hypertension Mother    Diabetes Father    Hypertension Father    Cancer Father        Colon 79, lung- former smoker 55   Hyperlipidemia Father        mother    Medications- reviewed and updated Current Outpatient Medications  Medication Sig Dispense Refill   loteprednol (LOTEMAX) 0.5 % ophthalmic suspension Place 1 drop into the right eye in the morning and at bedtime.     methotrexate (RHEUMATREX) 2.5 MG tablet Take 8 tablets (20 mg total) by mouth once a week. 96 tablet 5   pantoprazole (PROTONIX) 40 MG tablet Take 1 tablet (40 mg total) by mouth daily. 90 tablet 1   valACYclovir (VALTREX) 500 MG tablet Take 1 tablet (500 mg total) by mouth 2 (two) times daily for three days with symptoms 30 tablet 0   brimonidine-timolol (  COMBIGAN) 0.2-0.5 % ophthalmic solution Place 1 drop into the right eye every 12 hours.     clobetasol ointment (TEMOVATE) 0.05 % Apply topically a thin layer to the affected area(s) 2 (two) times daily. 30 g 1   folic acid (FOLVITE) 1 MG tablet Take by mouth.     methotrexate (RHEUMATREX) 2.5 MG tablet Take 10 tablets (25 mg total) by mouth once a week. 120 tablet 5   No current facility-administered medications for this visit.    Allergies-reviewed and updated No Known Allergies  Social History   Social History Narrative   Married. 3 children- [name] (sounds like isla)  and  [name]-  daughters. 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 Goes for walks with kids. Enjoys shopping/spending money.          Objective  Objective:  BP 108/80   Pulse 72   Temp 98 F (36.7 C)   Ht '5\' 2"'$  (1.575 m)   Wt 147 lb (66.7 kg)   SpO2 98%   BMI 26.89 kg/m  Gen: NAD, resting comfortably HEENT: Mucous membranes are moist. Oropharynx normal Neck: no thyromegaly CV: RRR no murmurs rubs or gallops Lungs: CTAB no crackles, wheeze, rhonchi Abdomen: soft/nontender/nondistended/normal bowel sounds. No rebound or guarding.  Ext: no edema Skin: warm, dry Neuro: grossly normal, moves all extremities, PERRLA   Assessment and Plan   51 y.o. female presenting for annual physical.  Health Maintenance counseling: 1. Anticipatory guidance: Patient counseled regarding regular dental exams -q3 months, eye exams - very regular with uveitis q8 weeks,  avoiding smoking and second hand smoke , limiting alcohol to 1 beverage per day- doesn't drink , no illicit drugs .   2. Risk factor reduction:  Advised patient of need for regular exercise and diet rich and fruits and vegetables to reduce risk of heart attack and stroke.  Exercise- hard with starting job and balance with time for girls- she wants to get back to pilates- has not needed to see sports medicine in years Dr. Paulla Fore.  Diet/weight management-down 33 lbs from peak at home- enrolled in [employer]- and now doing her own maintenance- small meal 100-150 calories every hours- wants to lose a few more lbs.  Wt Readings from Last 3 Encounters:  03/24/22 147 lb (66.7 kg)  07/28/21 168 lb 3.2 oz (76.3 kg)  06/26/20 158 lb (71.7 kg)  3. Immunizations/screenings/ancillary studies- on methotrexate- wants to hold off on prevnar for now- may call back for this. Wants to hold  off on repeat covid for now-  Immunization History  Administered Date(s) Administered   Influenza Whole 09/18/2019   Influenza,inj,Quad PF,6+ Mos 11/28/2018, 08/05/2021   Influenza-Unspecified 07/28/2015, 08/18/2016   PFIZER(Purple Top)SARS-COV-2 Vaccination 11/13/2019, 12/04/2019   Tdap 08/10/2012  4. Cervical cancer screening- 11/23/17 with 5 year repeat due to HPV negative and monogomous  5. Breast cancer screening-  breast exam with GYn and mammogram 12/22/21 6. Colon cancer screening - 06/07/18 with plan for 5 year repeat due to family history 7. Skin cancer screening- has seen der min the past. advised regular sunscreen use. Denies worrisome, changing, or new skin lesions.  8. Birth control/STD check- IUD in place- plans for 7 years and monogomous 9. Osteoporosis screening at 8- consider later 10. Smoking associated screening -  never smoker  Status of chronic or acute concerns   #hyperlipidemia S: Medication:none  Lab Results  Component Value Date   CHOL 198 08/11/2018   HDL 62 08/11/2018   LDLCALC 123 08/11/2018   TRIG 66 08/11/2018   CHOLHDL 5 08/28/2015   A/P: update lipids- calculate ascvd risk. If risk above 5 or 7.5% consider ct cardiac scoring  #significant weight loss and has helped her back tremendousely  #prediabetes in several siblings- home a1c was 4.5 recently. Will check fasting cbg  Recommended follow up: No follow-ups on file. Future Appointments  Date Time Provider [place]  09/29/2022  8:00 AM Marin Olp, MD [facility]   Lab/Order associations: fasting   ICD-10-CM   1. Preventative health care  Z00.00     2. Hyperlipidemia, unspecified hyperlipidemia type  E78.5       No orders of the defined types were placed in this encounter.   Return precautions advised.  Garret Reddish, MD

## 2022-03-25 ENCOUNTER — Ambulatory Visit: Payer: 59 | Admitting: Family Medicine

## 2022-03-25 LAB — HEPATITIS C ANTIBODY
Hepatitis C Ab: NONREACTIVE
SIGNAL TO CUT-OFF: 0.1 (ref <1.00)

## 2022-03-31 ENCOUNTER — Other Ambulatory Visit (HOSPITAL_COMMUNITY): Payer: Self-pay

## 2022-04-01 ENCOUNTER — Other Ambulatory Visit (HOSPITAL_COMMUNITY): Payer: Self-pay

## 2022-04-01 MED ORDER — FOLIC ACID 1 MG PO TABS
5.0000 mg | ORAL_TABLET | Freq: Every day | ORAL | 5 refills | Status: DC
  Filled 2022-04-01 (×2): qty 450, 90d supply, fill #0

## 2022-06-16 ENCOUNTER — Other Ambulatory Visit: Payer: Self-pay | Admitting: Sports Medicine

## 2022-06-16 ENCOUNTER — Other Ambulatory Visit (HOSPITAL_COMMUNITY): Payer: Self-pay

## 2022-06-16 ENCOUNTER — Ambulatory Visit
Admission: RE | Admit: 2022-06-16 | Discharge: 2022-06-16 | Disposition: A | Discharge status: Home / Self Care | Payer: 59 | Source: Ambulatory Visit | Attending: Sports Medicine | Admitting: Sports Medicine

## 2022-06-16 DIAGNOSIS — M9905 Segmental and somatic dysfunction of pelvic region: Secondary | ICD-10-CM | POA: Diagnosis not present

## 2022-06-16 DIAGNOSIS — M9903 Segmental and somatic dysfunction of lumbar region: Secondary | ICD-10-CM | POA: Diagnosis not present

## 2022-06-16 DIAGNOSIS — M9906 Segmental and somatic dysfunction of lower extremity: Secondary | ICD-10-CM | POA: Diagnosis not present

## 2022-06-16 DIAGNOSIS — M9904 Segmental and somatic dysfunction of sacral region: Secondary | ICD-10-CM | POA: Diagnosis not present

## 2022-06-16 DIAGNOSIS — M25552 Pain in left hip: Secondary | ICD-10-CM

## 2022-06-16 MED ORDER — MELOXICAM 15 MG PO TABS
15.0000 mg | ORAL_TABLET | Freq: Every day | ORAL | 1 refills | Status: DC
  Filled 2022-06-16: qty 30, 30d supply, fill #0
  Filled 2023-03-15: qty 30, 30d supply, fill #1

## 2022-06-17 ENCOUNTER — Other Ambulatory Visit (HOSPITAL_COMMUNITY): Payer: Self-pay

## 2022-06-22 DIAGNOSIS — M545 Low back pain, unspecified: Secondary | ICD-10-CM | POA: Diagnosis not present

## 2022-06-22 DIAGNOSIS — M25452 Effusion, left hip: Secondary | ICD-10-CM | POA: Diagnosis not present

## 2022-06-22 DIAGNOSIS — M25552 Pain in left hip: Secondary | ICD-10-CM | POA: Diagnosis not present

## 2022-06-30 DIAGNOSIS — M9903 Segmental and somatic dysfunction of lumbar region: Secondary | ICD-10-CM | POA: Diagnosis not present

## 2022-06-30 DIAGNOSIS — M9905 Segmental and somatic dysfunction of pelvic region: Secondary | ICD-10-CM | POA: Diagnosis not present

## 2022-06-30 DIAGNOSIS — M9904 Segmental and somatic dysfunction of sacral region: Secondary | ICD-10-CM | POA: Diagnosis not present

## 2022-06-30 DIAGNOSIS — M545 Low back pain, unspecified: Secondary | ICD-10-CM | POA: Diagnosis not present

## 2022-06-30 DIAGNOSIS — M9902 Segmental and somatic dysfunction of thoracic region: Secondary | ICD-10-CM | POA: Diagnosis not present

## 2022-06-30 DIAGNOSIS — M9906 Segmental and somatic dysfunction of lower extremity: Secondary | ICD-10-CM | POA: Diagnosis not present

## 2022-06-30 DIAGNOSIS — M25552 Pain in left hip: Secondary | ICD-10-CM | POA: Diagnosis not present

## 2022-06-30 DIAGNOSIS — M9908 Segmental and somatic dysfunction of rib cage: Secondary | ICD-10-CM | POA: Diagnosis not present

## 2022-07-01 ENCOUNTER — Other Ambulatory Visit (HOSPITAL_COMMUNITY): Payer: Self-pay

## 2022-07-01 MED ORDER — METHYLPREDNISOLONE 4 MG PO TBPK
ORAL_TABLET | ORAL | 0 refills | Status: DC
  Filled 2022-07-01: qty 21, 6d supply, fill #0

## 2022-07-02 ENCOUNTER — Other Ambulatory Visit (HOSPITAL_COMMUNITY): Payer: Self-pay

## 2022-07-02 DIAGNOSIS — M545 Low back pain, unspecified: Secondary | ICD-10-CM | POA: Diagnosis not present

## 2022-07-02 DIAGNOSIS — M25552 Pain in left hip: Secondary | ICD-10-CM | POA: Diagnosis not present

## 2022-07-02 DIAGNOSIS — M9906 Segmental and somatic dysfunction of lower extremity: Secondary | ICD-10-CM | POA: Diagnosis not present

## 2022-07-02 DIAGNOSIS — M9903 Segmental and somatic dysfunction of lumbar region: Secondary | ICD-10-CM | POA: Diagnosis not present

## 2022-07-02 DIAGNOSIS — M9902 Segmental and somatic dysfunction of thoracic region: Secondary | ICD-10-CM | POA: Diagnosis not present

## 2022-07-02 DIAGNOSIS — M9905 Segmental and somatic dysfunction of pelvic region: Secondary | ICD-10-CM | POA: Diagnosis not present

## 2022-07-02 DIAGNOSIS — M9904 Segmental and somatic dysfunction of sacral region: Secondary | ICD-10-CM | POA: Diagnosis not present

## 2022-07-02 MED ORDER — DIAZEPAM 2 MG PO TABS
2.0000 mg | ORAL_TABLET | Freq: Three times a day (TID) | ORAL | 0 refills | Status: DC | PRN
  Filled 2022-07-02: qty 30, 5d supply, fill #0

## 2022-07-07 ENCOUNTER — Other Ambulatory Visit: Payer: Self-pay | Admitting: Sports Medicine

## 2022-07-07 DIAGNOSIS — M545 Low back pain, unspecified: Secondary | ICD-10-CM

## 2022-07-07 DIAGNOSIS — M25552 Pain in left hip: Secondary | ICD-10-CM

## 2022-07-12 ENCOUNTER — Encounter: Payer: Self-pay | Admitting: *Deleted

## 2022-07-20 ENCOUNTER — Other Ambulatory Visit (HOSPITAL_COMMUNITY): Payer: Self-pay

## 2022-07-20 DIAGNOSIS — H15001 Unspecified scleritis, right eye: Secondary | ICD-10-CM | POA: Diagnosis not present

## 2022-07-20 DIAGNOSIS — H35372 Puckering of macula, left eye: Secondary | ICD-10-CM | POA: Diagnosis not present

## 2022-07-20 DIAGNOSIS — Z961 Presence of intraocular lens: Secondary | ICD-10-CM | POA: Diagnosis not present

## 2022-07-20 DIAGNOSIS — Z79899 Other long term (current) drug therapy: Secondary | ICD-10-CM | POA: Diagnosis not present

## 2022-07-20 DIAGNOSIS — H53002 Unspecified amblyopia, left eye: Secondary | ICD-10-CM | POA: Diagnosis not present

## 2022-07-20 DIAGNOSIS — H15101 Unspecified episcleritis, right eye: Secondary | ICD-10-CM | POA: Diagnosis not present

## 2022-07-20 DIAGNOSIS — H40051 Ocular hypertension, right eye: Secondary | ICD-10-CM | POA: Diagnosis not present

## 2022-07-20 MED ORDER — METHOTREXATE 2.5 MG PO TABS
20.0000 mg | ORAL_TABLET | ORAL | 5 refills | Status: DC
  Filled 2022-07-20: qty 96, 84d supply, fill #0

## 2022-07-20 MED ORDER — FOLIC ACID 1 MG PO TABS
5.0000 mg | ORAL_TABLET | Freq: Every day | ORAL | 5 refills | Status: DC
  Filled 2022-07-20 – 2022-10-06 (×3): qty 450, 90d supply, fill #0

## 2022-07-27 ENCOUNTER — Ambulatory Visit
Admission: RE | Admit: 2022-07-27 | Discharge: 2022-07-27 | Disposition: A | Discharge status: Home / Self Care | Payer: 59 | Source: Ambulatory Visit | Attending: Sports Medicine | Admitting: Sports Medicine

## 2022-07-27 DIAGNOSIS — M25552 Pain in left hip: Secondary | ICD-10-CM

## 2022-07-27 DIAGNOSIS — M545 Low back pain, unspecified: Secondary | ICD-10-CM

## 2022-07-27 MED ORDER — IOPAMIDOL (ISOVUE-M 200) INJECTION 41%
15.0000 mL | Freq: Once | INTRAMUSCULAR | Status: AC
  Administered 2022-07-27: 15 mL via INTRA_ARTICULAR

## 2022-07-30 ENCOUNTER — Other Ambulatory Visit (HOSPITAL_COMMUNITY): Payer: Self-pay

## 2022-07-30 DIAGNOSIS — M25552 Pain in left hip: Secondary | ICD-10-CM | POA: Diagnosis not present

## 2022-07-30 DIAGNOSIS — M9906 Segmental and somatic dysfunction of lower extremity: Secondary | ICD-10-CM | POA: Diagnosis not present

## 2022-08-03 ENCOUNTER — Other Ambulatory Visit (HOSPITAL_COMMUNITY): Payer: Self-pay

## 2022-08-03 MED ORDER — ACYCLOVIR 5 % EX OINT
TOPICAL_OINTMENT | Freq: Every day | CUTANEOUS | 0 refills | Status: DC
  Filled 2022-08-03: qty 30, 30d supply, fill #0

## 2022-08-06 ENCOUNTER — Other Ambulatory Visit (HOSPITAL_COMMUNITY): Payer: Self-pay

## 2022-08-06 MED ORDER — METHOTREXATE SODIUM 2.5 MG PO TABS
20.0000 mg | ORAL_TABLET | ORAL | 5 refills | Status: DC
  Filled 2022-08-06 – 2022-10-05 (×2): qty 96, 84d supply, fill #0

## 2022-08-26 DIAGNOSIS — H15001 Unspecified scleritis, right eye: Secondary | ICD-10-CM | POA: Diagnosis not present

## 2022-08-26 DIAGNOSIS — H15101 Unspecified episcleritis, right eye: Secondary | ICD-10-CM | POA: Diagnosis not present

## 2022-08-26 DIAGNOSIS — Z79899 Other long term (current) drug therapy: Secondary | ICD-10-CM | POA: Diagnosis not present

## 2022-08-27 ENCOUNTER — Other Ambulatory Visit (HOSPITAL_COMMUNITY): Payer: Self-pay

## 2022-08-27 MED ORDER — AMOXICILLIN-POT CLAVULANATE 875-125 MG PO TABS
1.0000 | ORAL_TABLET | Freq: Two times a day (BID) | ORAL | 0 refills | Status: DC
  Filled 2022-08-27 – 2022-09-16 (×2): qty 20, 10d supply, fill #0

## 2022-08-27 MED ORDER — ONDANSETRON 4 MG PO TBDP
4.0000 mg | ORAL_TABLET | ORAL | 0 refills | Status: DC
  Filled 2022-08-27 – 2022-09-16 (×2): qty 30, 5d supply, fill #0

## 2022-09-06 ENCOUNTER — Other Ambulatory Visit (HOSPITAL_COMMUNITY): Payer: Self-pay

## 2022-09-16 ENCOUNTER — Other Ambulatory Visit (HOSPITAL_COMMUNITY): Payer: Self-pay

## 2022-09-17 ENCOUNTER — Other Ambulatory Visit (HOSPITAL_COMMUNITY): Payer: Self-pay

## 2022-09-29 ENCOUNTER — Encounter: Payer: 59 | Admitting: Family Medicine

## 2022-09-30 ENCOUNTER — Encounter: Payer: Self-pay | Admitting: *Deleted

## 2022-09-30 ENCOUNTER — Other Ambulatory Visit (HOSPITAL_COMMUNITY): Payer: Self-pay

## 2022-10-05 ENCOUNTER — Other Ambulatory Visit: Payer: Self-pay

## 2022-10-05 ENCOUNTER — Other Ambulatory Visit (HOSPITAL_COMMUNITY): Payer: Self-pay

## 2022-10-06 ENCOUNTER — Other Ambulatory Visit (HOSPITAL_COMMUNITY): Payer: Self-pay

## 2022-10-06 MED ORDER — VALACYCLOVIR HCL 500 MG PO TABS
500.0000 mg | ORAL_TABLET | Freq: Two times a day (BID) | ORAL | 0 refills | Status: DC
  Filled 2022-10-06: qty 90, 90d supply, fill #0

## 2022-10-22 ENCOUNTER — Other Ambulatory Visit (HOSPITAL_COMMUNITY): Payer: Self-pay

## 2022-10-26 ENCOUNTER — Other Ambulatory Visit (HOSPITAL_COMMUNITY): Payer: Self-pay

## 2022-10-26 DIAGNOSIS — Z79899 Other long term (current) drug therapy: Secondary | ICD-10-CM | POA: Diagnosis not present

## 2022-10-26 DIAGNOSIS — H15101 Unspecified episcleritis, right eye: Secondary | ICD-10-CM | POA: Diagnosis not present

## 2022-10-26 DIAGNOSIS — H53002 Unspecified amblyopia, left eye: Secondary | ICD-10-CM | POA: Diagnosis not present

## 2022-10-26 DIAGNOSIS — H40051 Ocular hypertension, right eye: Secondary | ICD-10-CM | POA: Diagnosis not present

## 2022-10-26 DIAGNOSIS — H35372 Puckering of macula, left eye: Secondary | ICD-10-CM | POA: Diagnosis not present

## 2022-10-26 DIAGNOSIS — H15001 Unspecified scleritis, right eye: Secondary | ICD-10-CM | POA: Diagnosis not present

## 2022-10-26 DIAGNOSIS — Z961 Presence of intraocular lens: Secondary | ICD-10-CM | POA: Diagnosis not present

## 2022-10-26 MED ORDER — METHOTREXATE SODIUM 2.5 MG PO TABS
20.0000 mg | ORAL_TABLET | ORAL | 5 refills | Status: DC
  Filled 2022-10-26 – 2022-12-24 (×2): qty 96, 84d supply, fill #0

## 2022-10-26 MED ORDER — LOTEPREDNOL ETABONATE 0.5 % OP SUSP
1.0000 [drp] | Freq: Four times a day (QID) | OPHTHALMIC | 11 refills | Status: DC
  Filled 2022-10-26: qty 15, 38d supply, fill #0

## 2022-10-26 MED ORDER — FOLIC ACID 1 MG PO TABS
5.0000 mg | ORAL_TABLET | Freq: Every day | ORAL | 5 refills | Status: DC
  Filled 2022-10-26 – 2022-12-24 (×2): qty 450, 90d supply, fill #0

## 2022-12-21 ENCOUNTER — Other Ambulatory Visit (HOSPITAL_COMMUNITY): Payer: Self-pay

## 2022-12-21 DIAGNOSIS — H209 Unspecified iridocyclitis: Secondary | ICD-10-CM | POA: Diagnosis not present

## 2022-12-21 DIAGNOSIS — Z6831 Body mass index (BMI) 31.0-31.9, adult: Secondary | ICD-10-CM | POA: Diagnosis not present

## 2022-12-21 DIAGNOSIS — Z1331 Encounter for screening for depression: Secondary | ICD-10-CM | POA: Diagnosis not present

## 2022-12-21 DIAGNOSIS — E8889 Other specified metabolic disorders: Secondary | ICD-10-CM | POA: Diagnosis not present

## 2022-12-21 DIAGNOSIS — E782 Mixed hyperlipidemia: Secondary | ICD-10-CM | POA: Diagnosis not present

## 2022-12-21 DIAGNOSIS — E6609 Other obesity due to excess calories: Secondary | ICD-10-CM | POA: Diagnosis not present

## 2022-12-21 DIAGNOSIS — Z131 Encounter for screening for diabetes mellitus: Secondary | ICD-10-CM | POA: Diagnosis not present

## 2022-12-21 MED ORDER — ZEPBOUND 2.5 MG/0.5ML ~~LOC~~ SOAJ
2.5000 mg | SUBCUTANEOUS | 0 refills | Status: DC
  Filled 2022-12-21: qty 2, 28d supply, fill #0

## 2022-12-24 ENCOUNTER — Other Ambulatory Visit (HOSPITAL_BASED_OUTPATIENT_CLINIC_OR_DEPARTMENT_OTHER): Payer: Self-pay

## 2022-12-24 ENCOUNTER — Other Ambulatory Visit (HOSPITAL_COMMUNITY): Payer: Self-pay

## 2023-01-04 DIAGNOSIS — E6609 Other obesity due to excess calories: Secondary | ICD-10-CM | POA: Diagnosis not present

## 2023-01-04 DIAGNOSIS — H209 Unspecified iridocyclitis: Secondary | ICD-10-CM | POA: Diagnosis not present

## 2023-01-04 DIAGNOSIS — E782 Mixed hyperlipidemia: Secondary | ICD-10-CM | POA: Diagnosis not present

## 2023-01-04 DIAGNOSIS — Z6830 Body mass index (BMI) 30.0-30.9, adult: Secondary | ICD-10-CM | POA: Diagnosis not present

## 2023-01-18 ENCOUNTER — Other Ambulatory Visit (HOSPITAL_COMMUNITY): Payer: Self-pay

## 2023-01-18 DIAGNOSIS — E6609 Other obesity due to excess calories: Secondary | ICD-10-CM | POA: Diagnosis not present

## 2023-01-18 DIAGNOSIS — H209 Unspecified iridocyclitis: Secondary | ICD-10-CM | POA: Diagnosis not present

## 2023-01-18 DIAGNOSIS — Z6830 Body mass index (BMI) 30.0-30.9, adult: Secondary | ICD-10-CM | POA: Diagnosis not present

## 2023-01-18 DIAGNOSIS — E782 Mixed hyperlipidemia: Secondary | ICD-10-CM | POA: Diagnosis not present

## 2023-01-18 MED ORDER — ZEPBOUND 15 MG/0.5ML ~~LOC~~ SOAJ
15.0000 mg | SUBCUTANEOUS | 0 refills | Status: DC
  Filled 2023-01-18: qty 2, 28d supply, fill #0

## 2023-01-21 ENCOUNTER — Other Ambulatory Visit (HOSPITAL_COMMUNITY): Payer: Self-pay

## 2023-02-07 ENCOUNTER — Other Ambulatory Visit (HOSPITAL_COMMUNITY): Payer: Self-pay

## 2023-02-07 DIAGNOSIS — Z6830 Body mass index (BMI) 30.0-30.9, adult: Secondary | ICD-10-CM | POA: Diagnosis not present

## 2023-02-07 DIAGNOSIS — E6609 Other obesity due to excess calories: Secondary | ICD-10-CM | POA: Diagnosis not present

## 2023-02-07 DIAGNOSIS — H209 Unspecified iridocyclitis: Secondary | ICD-10-CM | POA: Diagnosis not present

## 2023-02-07 DIAGNOSIS — E782 Mixed hyperlipidemia: Secondary | ICD-10-CM | POA: Diagnosis not present

## 2023-02-07 MED ORDER — ZEPBOUND 15 MG/0.5ML ~~LOC~~ SOAJ
15.0000 mg | SUBCUTANEOUS | 0 refills | Status: DC
  Filled 2023-02-07 – 2023-03-09 (×3): qty 2, 28d supply, fill #0

## 2023-02-09 ENCOUNTER — Other Ambulatory Visit: Payer: Self-pay

## 2023-02-09 ENCOUNTER — Other Ambulatory Visit (HOSPITAL_COMMUNITY): Payer: Self-pay

## 2023-02-09 DIAGNOSIS — Z113 Encounter for screening for infections with a predominantly sexual mode of transmission: Secondary | ICD-10-CM | POA: Diagnosis not present

## 2023-02-09 DIAGNOSIS — Z1159 Encounter for screening for other viral diseases: Secondary | ICD-10-CM | POA: Diagnosis not present

## 2023-02-09 DIAGNOSIS — Z118 Encounter for screening for other infectious and parasitic diseases: Secondary | ICD-10-CM | POA: Diagnosis not present

## 2023-02-09 DIAGNOSIS — Z0142 Encounter for cervical smear to confirm findings of recent normal smear following initial abnormal smear: Secondary | ICD-10-CM | POA: Diagnosis not present

## 2023-02-09 DIAGNOSIS — Z114 Encounter for screening for human immunodeficiency virus [HIV]: Secondary | ICD-10-CM | POA: Diagnosis not present

## 2023-02-09 DIAGNOSIS — Z1231 Encounter for screening mammogram for malignant neoplasm of breast: Secondary | ICD-10-CM | POA: Diagnosis not present

## 2023-02-09 DIAGNOSIS — Z124 Encounter for screening for malignant neoplasm of cervix: Secondary | ICD-10-CM | POA: Diagnosis not present

## 2023-02-09 DIAGNOSIS — Z01419 Encounter for gynecological examination (general) (routine) without abnormal findings: Secondary | ICD-10-CM | POA: Diagnosis not present

## 2023-02-09 DIAGNOSIS — Z01411 Encounter for gynecological examination (general) (routine) with abnormal findings: Secondary | ICD-10-CM | POA: Diagnosis not present

## 2023-02-09 LAB — HM MAMMOGRAPHY

## 2023-02-09 MED ORDER — VALACYCLOVIR HCL 500 MG PO TABS
ORAL_TABLET | ORAL | 0 refills | Status: AC
  Filled 2023-02-09 – 2023-03-11 (×2): qty 30, 27d supply, fill #0

## 2023-02-14 ENCOUNTER — Other Ambulatory Visit (HOSPITAL_COMMUNITY): Payer: Self-pay

## 2023-02-16 ENCOUNTER — Other Ambulatory Visit: Payer: Self-pay

## 2023-02-18 LAB — HM PAP SMEAR: HPV, high-risk: NEGATIVE

## 2023-02-24 ENCOUNTER — Other Ambulatory Visit (HOSPITAL_COMMUNITY): Payer: Self-pay

## 2023-03-01 ENCOUNTER — Other Ambulatory Visit (HOSPITAL_COMMUNITY): Payer: Self-pay

## 2023-03-01 DIAGNOSIS — H15101 Unspecified episcleritis, right eye: Secondary | ICD-10-CM | POA: Diagnosis not present

## 2023-03-01 DIAGNOSIS — H53002 Unspecified amblyopia, left eye: Secondary | ICD-10-CM | POA: Diagnosis not present

## 2023-03-01 DIAGNOSIS — Z961 Presence of intraocular lens: Secondary | ICD-10-CM | POA: Diagnosis not present

## 2023-03-01 DIAGNOSIS — H35372 Puckering of macula, left eye: Secondary | ICD-10-CM | POA: Diagnosis not present

## 2023-03-01 DIAGNOSIS — H15001 Unspecified scleritis, right eye: Secondary | ICD-10-CM | POA: Diagnosis not present

## 2023-03-01 DIAGNOSIS — H40051 Ocular hypertension, right eye: Secondary | ICD-10-CM | POA: Diagnosis not present

## 2023-03-01 DIAGNOSIS — Z79899 Other long term (current) drug therapy: Secondary | ICD-10-CM | POA: Diagnosis not present

## 2023-03-01 MED ORDER — LOTEPREDNOL ETABONATE 0.5 % OP SUSP
1.0000 [drp] | Freq: Four times a day (QID) | OPHTHALMIC | 11 refills | Status: AC
  Filled 2023-03-01: qty 15, 25d supply, fill #0

## 2023-03-01 MED ORDER — FOLIC ACID 1 MG PO TABS
5.0000 mg | ORAL_TABLET | Freq: Every day | ORAL | 5 refills | Status: DC
  Filled 2023-03-01 – 2023-03-09 (×2): qty 450, 90d supply, fill #0
  Filled 2023-06-21: qty 450, 90d supply, fill #1

## 2023-03-01 MED ORDER — METHOTREXATE SODIUM 2.5 MG PO TABS
15.0000 mg | ORAL_TABLET | ORAL | 5 refills | Status: DC
  Filled 2023-03-01: qty 72, 84d supply, fill #0
  Filled 2023-06-21: qty 96, 112d supply, fill #0

## 2023-03-02 ENCOUNTER — Other Ambulatory Visit (HOSPITAL_COMMUNITY): Payer: Self-pay

## 2023-03-02 MED ORDER — METHOTREXATE SODIUM 2.5 MG PO TABS
15.0000 mg | ORAL_TABLET | ORAL | 3 refills | Status: DC
  Filled 2023-03-02: qty 72, 84d supply, fill #0

## 2023-03-09 ENCOUNTER — Other Ambulatory Visit (HOSPITAL_COMMUNITY): Payer: Self-pay

## 2023-03-09 ENCOUNTER — Other Ambulatory Visit: Payer: Self-pay

## 2023-03-11 ENCOUNTER — Other Ambulatory Visit (HOSPITAL_COMMUNITY): Payer: Self-pay

## 2023-03-11 MED ORDER — ONDANSETRON 4 MG PO TBDP
4.0000 mg | ORAL_TABLET | Freq: Four times a day (QID) | ORAL | 1 refills | Status: DC
  Filled 2023-03-11: qty 20, 5d supply, fill #0

## 2023-03-15 ENCOUNTER — Other Ambulatory Visit (HOSPITAL_COMMUNITY): Payer: Self-pay

## 2023-03-18 ENCOUNTER — Other Ambulatory Visit (HOSPITAL_COMMUNITY): Payer: Self-pay

## 2023-03-18 MED ORDER — AMOXICILLIN-POT CLAVULANATE 875-125 MG PO TABS
1.0000 | ORAL_TABLET | Freq: Two times a day (BID) | ORAL | 1 refills | Status: DC
  Filled 2023-03-18: qty 20, 10d supply, fill #0

## 2023-06-16 DIAGNOSIS — H15109 Unspecified episcleritis, unspecified eye: Secondary | ICD-10-CM | POA: Diagnosis not present

## 2023-06-16 DIAGNOSIS — Z800 Family history of malignant neoplasm of digestive organs: Secondary | ICD-10-CM | POA: Diagnosis not present

## 2023-06-16 DIAGNOSIS — R194 Change in bowel habit: Secondary | ICD-10-CM | POA: Diagnosis not present

## 2023-06-16 DIAGNOSIS — Z1211 Encounter for screening for malignant neoplasm of colon: Secondary | ICD-10-CM | POA: Diagnosis not present

## 2023-06-21 ENCOUNTER — Other Ambulatory Visit (HOSPITAL_COMMUNITY): Payer: Self-pay

## 2023-06-23 ENCOUNTER — Other Ambulatory Visit (HOSPITAL_COMMUNITY): Payer: Self-pay

## 2023-06-23 MED ORDER — METHOTREXATE SODIUM 2.5 MG PO TABS
15.0000 mg | ORAL_TABLET | ORAL | 3 refills | Status: DC
  Filled 2023-06-23: qty 24, 28d supply, fill #0

## 2023-07-01 ENCOUNTER — Other Ambulatory Visit (HOSPITAL_COMMUNITY): Payer: Self-pay

## 2023-07-01 MED ORDER — AMOXICILLIN-POT CLAVULANATE 875-125 MG PO TABS
1.0000 | ORAL_TABLET | Freq: Two times a day (BID) | ORAL | 1 refills | Status: DC
  Filled 2023-07-01: qty 20, 10d supply, fill #0

## 2023-07-04 ENCOUNTER — Other Ambulatory Visit (HOSPITAL_COMMUNITY): Payer: Self-pay

## 2023-07-04 MED ORDER — GOLYTELY 236 G PO SOLR
ORAL | 0 refills | Status: AC
  Filled 2023-07-04: qty 4000, 1d supply, fill #0

## 2023-07-05 ENCOUNTER — Other Ambulatory Visit (HOSPITAL_COMMUNITY): Payer: Self-pay

## 2023-07-05 DIAGNOSIS — H53002 Unspecified amblyopia, left eye: Secondary | ICD-10-CM | POA: Diagnosis not present

## 2023-07-05 DIAGNOSIS — H35372 Puckering of macula, left eye: Secondary | ICD-10-CM | POA: Diagnosis not present

## 2023-07-05 DIAGNOSIS — H15001 Unspecified scleritis, right eye: Secondary | ICD-10-CM | POA: Diagnosis not present

## 2023-07-05 DIAGNOSIS — Z961 Presence of intraocular lens: Secondary | ICD-10-CM | POA: Diagnosis not present

## 2023-07-05 DIAGNOSIS — H40051 Ocular hypertension, right eye: Secondary | ICD-10-CM | POA: Diagnosis not present

## 2023-07-05 DIAGNOSIS — Z79899 Other long term (current) drug therapy: Secondary | ICD-10-CM | POA: Diagnosis not present

## 2023-07-05 DIAGNOSIS — H15101 Unspecified episcleritis, right eye: Secondary | ICD-10-CM | POA: Diagnosis not present

## 2023-07-05 MED ORDER — FOLIC ACID 1 MG PO TABS
5.0000 mg | ORAL_TABLET | Freq: Every day | ORAL | 5 refills | Status: DC
  Filled 2023-07-05 – 2023-10-26 (×2): qty 450, 90d supply, fill #0
  Filled 2024-03-14: qty 450, 90d supply, fill #1

## 2023-07-05 MED ORDER — METHOTREXATE SODIUM 2.5 MG PO TABS
15.0000 mg | ORAL_TABLET | ORAL | 3 refills | Status: DC
  Filled 2023-07-05: qty 48, 56d supply, fill #0
  Filled 2023-08-12: qty 72, 84d supply, fill #0
  Filled 2023-10-26: qty 72, 84d supply, fill #1
  Filled 2023-11-28: qty 48, 56d supply, fill #2

## 2023-07-12 ENCOUNTER — Encounter: Payer: Self-pay | Admitting: Family Medicine

## 2023-07-12 ENCOUNTER — Ambulatory Visit (INDEPENDENT_AMBULATORY_CARE_PROVIDER_SITE_OTHER): Payer: 59 | Admitting: Family Medicine

## 2023-07-12 VITALS — BP 100/64 | HR 66 | Temp 97.7°F | Resp 18 | Ht 62.0 in | Wt 157.4 lb

## 2023-07-12 DIAGNOSIS — E785 Hyperlipidemia, unspecified: Secondary | ICD-10-CM | POA: Diagnosis not present

## 2023-07-12 DIAGNOSIS — E663 Overweight: Secondary | ICD-10-CM | POA: Diagnosis not present

## 2023-07-12 DIAGNOSIS — Z23 Encounter for immunization: Secondary | ICD-10-CM

## 2023-07-12 DIAGNOSIS — Z131 Encounter for screening for diabetes mellitus: Secondary | ICD-10-CM | POA: Diagnosis not present

## 2023-07-12 DIAGNOSIS — Z0000 Encounter for general adult medical examination without abnormal findings: Secondary | ICD-10-CM

## 2023-07-12 LAB — LIPID PANEL
Cholesterol: 237 mg/dL — ABNORMAL HIGH (ref 0–200)
HDL: 62.5 mg/dL (ref >39.00)
LDL Cholesterol: 157 mg/dL — ABNORMAL HIGH (ref 0–99)
NonHDL: 174.97
Total CHOL/HDL Ratio: 4
Triglycerides: 89 mg/dL (ref 0.0–149.0)
VLDL: 17.8 mg/dL (ref 0.0–40.0)

## 2023-07-12 LAB — HEMOGLOBIN A1C: Hgb A1c MFr Bld: 5.3 % (ref 4.6–6.5)

## 2023-07-12 NOTE — Addendum Note (Signed)
Addended by: Jobe Gibbon on: 07/12/2023 09:55 AM   Modules accepted: Orders

## 2023-07-12 NOTE — Patient Instructions (Addendum)
Tetanus, Diphtheria, and Pertussis (Tdap) and flu today -double check with Dr. Sherryll Burger about holding methotrexate around shingrix- can call back for nurse visit  Please stop by lab before you go If you have mychart- we will send your results within 3 business days of [place] receiving them.  If you do not have mychart- we will call you about results within 5 business days of [place] receiving them.  *please also note that you will see labs on mychart as soon as they post. I will later go in and write notes on them- will say "notes from Dr. Durene Cal"    Recommended follow up: Return in about 1 year (around 07/11/2024) for physical or sooner if needed.Schedule b4 you leave.

## 2023-07-12 NOTE — Progress Notes (Signed)
Phone [phone]   Subjective:  Patient presents today for their annual physical. Chief complaint-noted.   See problem oriented charting- ROS- full  review of systems was completed and negative except for: some sore throat last week and held methotrexate for 1 week, but on this week  The following were reviewed and entered/updated in epic: Past Medical History:  Diagnosis Date   Chronic cholecystitis    GERD (gastroesophageal reflux disease)    Tums prn, worse in pregnancy   HSV infection    Has never had an outbreak on valtrex, fever blister   Miscarriage    Preglaucoma, unspecified    elevated IOP/  right eye   Scleritis    on methotrexate   Unspecified iridocyclitis    right eye   Uveitis    Patient Active Problem List   Diagnosis Date Noted   Uveitis     Priority: High   Benign paroxysmal positional vertigo 06/26/2020    Priority: Medium    Low back pain 06/26/2020    Priority: Low   Immunization, BCG 10/30/2019    Priority: Low   Family history of colon cancer 08/21/2015    Priority: Low   GERD (gastroesophageal reflux disease)     Priority: Low   S/P cataract extraction 02/02/2013    Priority: Low   Past Surgical History:  Procedure Laterality Date   ANAL FISSURE REPAIR  AGE 76   CATARACT EXTRACTION W/ INTRAOCULAR LENS IMPLANT Right APRIL 2014   CESAREAN SECTION N/A 04/22/2016   Procedure: CESAREAN SECTION;  Surgeon: Maxie Better, MD;  [facility];  Service: Obstetrics;  Laterality: N/A;   CHOLECYSTECTOMY N/A 09/05/2017   Procedure: LAPAROSCOPIC CHOLECYSTECTOMY;  Surgeon: Abigail Miyamoto, MD;  Location: [facility];  Service: General;  Laterality: N/A;   DILATION AND EVACUATION N/A 08/21/2013   miscarriage    Family History  Problem Relation Age of Onset   Diabetes Mother    Hypertension Mother    Diabetes Father    Hypertension Father    Cancer Father        Colon 69, lung- former smoker 48   Hyperlipidemia  Father        mother    Medications- reviewed and updated Current Outpatient Medications  Medication Sig Dispense Refill   folic acid (FOLVITE) 1 MG tablet Take 5 tablets (5 mg total) by mouth daily. 450 tablet 5   loteprednol (LOTEMAX) 0.5 % ophthalmic suspension Place 1 drop into both eyes 4 (four) times daily. 15 mL 11   methotrexate (RHEUMATREX) 2.5 MG tablet Take 6 tablets (15 mg total) by mouth once a week. 48 tablet 3   methotrexate (RHEUMATREX) 2.5 MG tablet Take by mouth.     ondansetron (ZOFRAN-ODT) 4 MG disintegrating tablet Take 1 tablet (4 mg total) by mouth every 6 (six) hours as needed 20 tablet 1   polyethylene glycol (GOLYTELY) 236 g solution as directed 4000 mL 0   No current facility-administered medications for this visit.    Allergies-reviewed and updated No Known Allergies  Social History   Social History Narrative   Married. 3 children- [name] (sounds like isla)  and  [place]- daughters. 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 Goes for walks with kids. Enjoys shopping/spending money.  Objective  Objective:  BP (!) 88/40   Pulse 66   Temp 97.7 F (36.5 C) (Temporal)   Resp 18   Ht 5\' 2"  (1.575 m)   Wt 157 lb 6 oz (71.4 kg)   SpO2 96%   Breastfeeding No   BMI 28.78 kg/m  Gen: NAD, resting comfortably HEENT: Mucous membranes are moist. Oropharynx normal Neck: no thyromegaly CV: RRR no murmurs rubs or gallops Lungs: CTAB no crackles, wheeze, rhonchi Abdomen: soft/nontender/nondistended/normal bowel sounds. No rebound or guarding.  Ext: no edema Skin: warm, dry Neuro: grossly normal, moves all extremities, PERRLA   Assessment and Plan   52 y.o. female presenting for annual physical.  Health Maintenance counseling: 1. Anticipatory guidance: Patient  counseled regarding regular dental exams -q3 months, eye exams-history of uveitis with regular follow-up with ophthalmology,  avoiding smoking and second hand smoke , limiting alcohol to 1 beverage per day-does not drink  , no illicit drugs .   2. Risk factor reduction:  Advised patient of need for regular exercise and diet rich and fruits and vegetables to reduce risk of heart attack and stroke.  Exercise- hard with work schedule and caring from girls.  Diet/weight management-up 10 pounds in the last year but was down 33 pounds from peak weight at home last year with optivia and was doing her own maintenance plan-had Zepbound for 2 months but stopped it. She has done a good job [employer] Readings from Last 3 Encounters:  07/12/23 157 lb 6 oz (71.4 kg)  03/24/22 147 lb (66.7 kg)  07/28/21 168 lb 3.2 oz (76.3 kg)  3. Immunizations/screenings/ancillary studies-/she wanted to hold off on Prevnar and COVID -she reports in last year did some valtrex for an itchy spot on back one sided- wondered about shingles and wants  -Tetanus, Diphtheria, and Pertussis (Tdap) and flu today -double check with Dr. Sherryll Burger about holding methotrexate around shingrix- can call back for nurse visit Immunization History  Administered Date(s) Administered   Influenza Whole 09/18/2019   Influenza,inj,Quad PF,6+ Mos 11/28/2018, 08/05/2021   Influenza-Unspecified 07/28/2015, 08/18/2016   PFIZER(Purple Top)SARS-COV-2 Vaccination 11/13/2019, 12/04/2019, 06/08/2020   Tdap 08/10/2012  4. Cervical cancer screening- requesting gynecology records- last on file  11/23/17 with 5 year repeat due to HPV negative and monogomous  5. Breast cancer screening-  breast exam with GYn and mammogram 02/09/23 6. Colon cancer screening - 06/07/18 with plan for 5 year repeat due to family history - scheduled in 6 days with Dr. Loreta Ave. Dad died of colon cancer 7. Skin cancer screening- has seen dermatology in the past- no recent issues  advised regular  sunscreen use. Denies worrisome, changing, or new skin lesions.  8. Birth control/STD check- IUD in place- plans for 7 years and monogamous  9. Osteoporosis screening at 65- consider later  10. Smoking associated screening - never smoker  Status of chronic or acute concerns   # social update- working part time contract work and enjoying her daughters  # Scleritis and episcleritis-follows with Dr. Sherryll Burger and Dr. Valere Dross. 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 Still on methotrexate but decreasing dose from 8 to 4 and no flares recently. Was able to come off valtrex. Tacrolimus considered. Also on folic acid. Does take lotemax with flares  #hyperlipidemia S: Medication:none. Despite weith loss #s actually went up last year Lab Results  Component Value Date   CHOL 278 (H) 03/24/2022   HDL 70.90 03/24/2022   LDLCALC 187 (H) 03/24/2022   TRIG 99.0 03/24/2022  CHOLHDL 4 03/24/2022   A/P: update lipid panel today - if over 180 consider CT calcium scoring and statin -shed be interested in CT calcium if still very elevated  #prediabetes screen- can get a1c   Recommended follow up: Return in about 1 year (around 07/11/2024) for physical or sooner if needed.Schedule b4 you leave.  Lab/Order associations: fasting   ICD-10-CM   1. Preventative health care  Z00.00     2. Hyperlipidemia, unspecified hyperlipidemia type  E78.5       No orders of the defined types were placed in this encounter.   Return precautions advised.  Tana Conch, MD

## 2023-07-14 ENCOUNTER — Encounter: Payer: Self-pay | Admitting: Family Medicine

## 2023-07-14 DIAGNOSIS — E785 Hyperlipidemia, unspecified: Secondary | ICD-10-CM

## 2023-07-14 NOTE — Telephone Encounter (Signed)
See below

## 2023-07-18 DIAGNOSIS — Z1211 Encounter for screening for malignant neoplasm of colon: Secondary | ICD-10-CM | POA: Diagnosis not present

## 2023-07-18 DIAGNOSIS — Q438 Other specified congenital malformations of intestine: Secondary | ICD-10-CM | POA: Diagnosis not present

## 2023-07-18 DIAGNOSIS — Z800 Family history of malignant neoplasm of digestive organs: Secondary | ICD-10-CM | POA: Diagnosis not present

## 2023-07-18 LAB — HM COLONOSCOPY

## 2023-08-01 ENCOUNTER — Other Ambulatory Visit: Payer: 59

## 2023-08-04 ENCOUNTER — Other Ambulatory Visit: Payer: 59

## 2023-08-05 ENCOUNTER — Other Ambulatory Visit: Payer: 59

## 2023-08-12 ENCOUNTER — Other Ambulatory Visit: Payer: 59

## 2023-08-12 ENCOUNTER — Other Ambulatory Visit (HOSPITAL_COMMUNITY): Payer: Self-pay

## 2023-08-19 DIAGNOSIS — H35372 Puckering of macula, left eye: Secondary | ICD-10-CM | POA: Diagnosis not present

## 2023-08-19 DIAGNOSIS — H15001 Unspecified scleritis, right eye: Secondary | ICD-10-CM | POA: Diagnosis not present

## 2023-08-19 DIAGNOSIS — Z961 Presence of intraocular lens: Secondary | ICD-10-CM | POA: Diagnosis not present

## 2023-08-19 DIAGNOSIS — H15101 Unspecified episcleritis, right eye: Secondary | ICD-10-CM | POA: Diagnosis not present

## 2023-08-19 DIAGNOSIS — H40051 Ocular hypertension, right eye: Secondary | ICD-10-CM | POA: Diagnosis not present

## 2023-08-19 DIAGNOSIS — H53002 Unspecified amblyopia, left eye: Secondary | ICD-10-CM | POA: Diagnosis not present

## 2023-09-12 ENCOUNTER — Other Ambulatory Visit (HOSPITAL_COMMUNITY): Payer: Self-pay

## 2023-09-12 MED ORDER — AMOXICILLIN-POT CLAVULANATE 875-125 MG PO TABS
1.0000 | ORAL_TABLET | Freq: Two times a day (BID) | ORAL | 2 refills | Status: DC
  Filled 2023-09-12: qty 20, 10d supply, fill #0
  Filled 2024-01-16: qty 20, 10d supply, fill #1

## 2023-10-25 ENCOUNTER — Ambulatory Visit
Admission: RE | Admit: 2023-10-25 | Discharge: 2023-10-25 | Discharge status: Home / Self Care | Payer: 59 | Source: Ambulatory Visit | Attending: Family Medicine | Admitting: Family Medicine

## 2023-10-25 DIAGNOSIS — E785 Hyperlipidemia, unspecified: Secondary | ICD-10-CM

## 2023-10-26 ENCOUNTER — Other Ambulatory Visit (HOSPITAL_COMMUNITY): Payer: Self-pay

## 2023-11-10 ENCOUNTER — Encounter: Payer: Self-pay | Admitting: Family Medicine

## 2023-11-18 ENCOUNTER — Other Ambulatory Visit (HOSPITAL_COMMUNITY): Payer: Self-pay

## 2023-11-18 ENCOUNTER — Other Ambulatory Visit: Payer: Self-pay

## 2023-11-18 DIAGNOSIS — E785 Hyperlipidemia, unspecified: Secondary | ICD-10-CM

## 2023-11-18 MED ORDER — ROSUVASTATIN CALCIUM 20 MG PO TABS
20.0000 mg | ORAL_TABLET | Freq: Every day | ORAL | 3 refills | Status: DC
  Filled 2023-11-18: qty 90, 90d supply, fill #0
  Filled 2024-03-14: qty 90, 90d supply, fill #1

## 2023-11-24 ENCOUNTER — Other Ambulatory Visit (HOSPITAL_COMMUNITY): Payer: Self-pay

## 2023-11-24 MED ORDER — VALACYCLOVIR HCL 500 MG PO TABS
500.0000 mg | ORAL_TABLET | Freq: Every day | ORAL | 0 refills | Status: AC
  Filled 2023-11-24: qty 90, 90d supply, fill #0

## 2023-11-28 ENCOUNTER — Other Ambulatory Visit (HOSPITAL_COMMUNITY): Payer: Self-pay

## 2023-11-29 ENCOUNTER — Encounter (HOSPITAL_COMMUNITY): Payer: Self-pay

## 2023-11-29 ENCOUNTER — Other Ambulatory Visit (HOSPITAL_COMMUNITY): Payer: Self-pay

## 2023-12-02 ENCOUNTER — Other Ambulatory Visit (HOSPITAL_COMMUNITY): Payer: Self-pay

## 2024-01-17 ENCOUNTER — Other Ambulatory Visit (HOSPITAL_COMMUNITY): Payer: Self-pay

## 2024-01-17 DIAGNOSIS — Z79899 Other long term (current) drug therapy: Secondary | ICD-10-CM | POA: Diagnosis not present

## 2024-01-17 DIAGNOSIS — Z961 Presence of intraocular lens: Secondary | ICD-10-CM | POA: Diagnosis not present

## 2024-01-17 DIAGNOSIS — H15101 Unspecified episcleritis, right eye: Secondary | ICD-10-CM | POA: Diagnosis not present

## 2024-01-17 DIAGNOSIS — H53002 Unspecified amblyopia, left eye: Secondary | ICD-10-CM | POA: Diagnosis not present

## 2024-01-17 DIAGNOSIS — H15001 Unspecified scleritis, right eye: Secondary | ICD-10-CM | POA: Diagnosis not present

## 2024-01-17 DIAGNOSIS — H40051 Ocular hypertension, right eye: Secondary | ICD-10-CM | POA: Diagnosis not present

## 2024-01-17 DIAGNOSIS — H35372 Puckering of macula, left eye: Secondary | ICD-10-CM | POA: Diagnosis not present

## 2024-01-17 DIAGNOSIS — Z13220 Encounter for screening for lipoid disorders: Secondary | ICD-10-CM | POA: Diagnosis not present

## 2024-01-17 MED ORDER — METHOTREXATE SODIUM 2.5 MG PO TABS
5.0000 mg | ORAL_TABLET | ORAL | 3 refills | Status: DC
  Filled 2024-01-17: qty 32, 35d supply, fill #0
  Filled 2024-01-17: qty 24, 84d supply, fill #0
  Filled 2024-03-14: qty 24, 84d supply, fill #1

## 2024-01-17 MED ORDER — FOLIC ACID 1 MG PO TABS
5.0000 mg | ORAL_TABLET | Freq: Every day | ORAL | 5 refills | Status: DC
  Filled 2024-01-17: qty 450, 90d supply, fill #0

## 2024-01-20 ENCOUNTER — Other Ambulatory Visit (HOSPITAL_COMMUNITY): Payer: Self-pay

## 2024-03-13 ENCOUNTER — Other Ambulatory Visit (HOSPITAL_COMMUNITY): Payer: Self-pay

## 2024-03-13 MED ORDER — ONDANSETRON 4 MG PO TBDP
4.0000 mg | ORAL_TABLET | Freq: Three times a day (TID) | ORAL | 0 refills | Status: AC | PRN
  Filled 2024-03-13: qty 30, 10d supply, fill #0

## 2024-03-13 MED ORDER — AMOXICILLIN-POT CLAVULANATE 875-125 MG PO TABS
1.0000 | ORAL_TABLET | Freq: Two times a day (BID) | ORAL | 0 refills | Status: DC
Start: 2024-03-13 — End: 2024-07-05
  Filled 2024-03-13: qty 20, 10d supply, fill #0

## 2024-03-14 ENCOUNTER — Other Ambulatory Visit (HOSPITAL_COMMUNITY): Payer: Self-pay

## 2024-03-19 ENCOUNTER — Other Ambulatory Visit (HOSPITAL_COMMUNITY): Payer: Self-pay

## 2024-03-19 MED ORDER — PANTOPRAZOLE SODIUM 40 MG PO TBEC
40.0000 mg | DELAYED_RELEASE_TABLET | Freq: Every day | ORAL | 1 refills | Status: AC
  Filled 2024-03-19: qty 90, 90d supply, fill #0

## 2024-06-12 ENCOUNTER — Encounter: Payer: Self-pay | Admitting: Podiatry

## 2024-06-12 ENCOUNTER — Ambulatory Visit (INDEPENDENT_AMBULATORY_CARE_PROVIDER_SITE_OTHER): Admitting: Podiatry

## 2024-06-12 ENCOUNTER — Ambulatory Visit (INDEPENDENT_AMBULATORY_CARE_PROVIDER_SITE_OTHER)

## 2024-06-12 DIAGNOSIS — S9031XA Contusion of right foot, initial encounter: Secondary | ICD-10-CM | POA: Diagnosis not present

## 2024-06-12 DIAGNOSIS — B351 Tinea unguium: Secondary | ICD-10-CM | POA: Diagnosis not present

## 2024-06-12 DIAGNOSIS — S90121A Contusion of right lesser toe(s) without damage to nail, initial encounter: Secondary | ICD-10-CM | POA: Diagnosis not present

## 2024-06-12 DIAGNOSIS — L608 Other nail disorders: Secondary | ICD-10-CM | POA: Diagnosis not present

## 2024-06-12 DIAGNOSIS — M7751 Other enthesopathy of right foot: Secondary | ICD-10-CM

## 2024-06-12 NOTE — Progress Notes (Unsigned)
 Chief Complaint  Patient presents with   Nail Problem    Pt is concerned with discolored Bilateral Great toes.   Toe Pain    Hit R 3rd and 4th toes and feels like they may be broken.  Pain 9 better today.  Iced.  Not diabetic. 81 mg asprin   HPI: 54 y.o. female presents today with concern of recent injury to the right 3rd and 4th toe.  Stubbed the toe recently and still has soreness noted mostly in the fourth toe.  Denies any cuts or breaks in the skin.  She has been wearing a gel sleeve over both toes together  She also has a secondary concern blackened toenails to the bilateral great toes.  States that they were not this dark when she had a swab of her toenails obtained by dermatology 3 years ago.  She states toenail clippings were never sent to the lab.  She is growing more concerned about the darker discoloration.  Denies trauma to the hallux nails.  Past Medical History:  Diagnosis Date   Chronic cholecystitis    GERD (gastroesophageal reflux disease)    Tums prn, worse in pregnancy   HSV infection    Has never had an outbreak on valtrex , fever blister   Miscarriage    Preglaucoma, unspecified    elevated IOP/  right eye   Scleritis    on methotrexate    Unspecified iridocyclitis    right eye   Uveitis    Past Surgical History:  Procedure Laterality Date   ANAL FISSURE REPAIR  AGE 74   CATARACT EXTRACTION W/ INTRAOCULAR LENS IMPLANT Right APRIL 2014   CESAREAN SECTION N/A 04/22/2016   Procedure: CESAREAN SECTION;  Surgeon: Dickie Carder, MD;  [facility]: [facility];  Service: Obstetrics;  Laterality: N/A;   CHOLECYSTECTOMY N/A 09/05/2017   Procedure: LAPAROSCOPIC CHOLECYSTECTOMY;  Surgeon: Vernetta Berg, MD;  Location: [facility];  Service: General;  Laterality: N/A;   DILATION AND EVACUATION N/A 08/21/2013   miscarriage   No Known Allergies   Physical Exam: Palpable pulses.  There is ecchymosis present to the right fourth toe with  localized edema.  There is some tenderness with palpation to the right 3rd and 4th toes.  No open lesions are noted.  The hallux nails have greenish-black discoloration from the subungual area along the central and distal 50% of the toenails.  No surrounding erythema is noted.  There is distal onycholysis  Radiographic Exam (right foot, 3 weightbearing views, 06/12/2024):  Normal osseous mineralization. No fractures noted.  Assessment/Plan of Care: 1. Contusion of right foot including toes, initial encounter   2. Fungal nail infection   3. Green nails     Reviewed clinical and radiographic findings with the patient today.  She was fitted for a Velcro buddy splint to wear on the right 3rd and 4th toes and she was shown how to apply this today.  Continue wearing this for 10 days to 2 weeks until pain resolves  Clippings were obtained of the bilateral hallux toenails.  One of the toenail clippings was sent to [facility] labs for fungal nail culture.  The other toenail clipping was sent to [facility] labs for aerobic/anaerobic culture to rule out Pseudomonas possibly causing a green nail.  Will call patient to review results and discuss treatment plan.   Renee [name], DPM, FACFAS [facility]     [address]  [place], [place] [address]                Office [phone]  Fax [phone]

## 2024-06-19 DIAGNOSIS — H35372 Puckering of macula, left eye: Secondary | ICD-10-CM | POA: Diagnosis not present

## 2024-06-19 DIAGNOSIS — H53002 Unspecified amblyopia, left eye: Secondary | ICD-10-CM | POA: Diagnosis not present

## 2024-06-19 DIAGNOSIS — Z79899 Other long term (current) drug therapy: Secondary | ICD-10-CM | POA: Diagnosis not present

## 2024-06-19 DIAGNOSIS — H15101 Unspecified episcleritis, right eye: Secondary | ICD-10-CM | POA: Diagnosis not present

## 2024-06-19 DIAGNOSIS — H15001 Unspecified scleritis, right eye: Secondary | ICD-10-CM | POA: Diagnosis not present

## 2024-06-19 DIAGNOSIS — Z961 Presence of intraocular lens: Secondary | ICD-10-CM | POA: Diagnosis not present

## 2024-06-19 DIAGNOSIS — Z13220 Encounter for screening for lipoid disorders: Secondary | ICD-10-CM | POA: Diagnosis not present

## 2024-06-24 ENCOUNTER — Ambulatory Visit: Payer: Self-pay | Admitting: Podiatry

## 2024-07-03 ENCOUNTER — Telehealth: Payer: Self-pay | Admitting: Lab

## 2024-07-03 ENCOUNTER — Other Ambulatory Visit (HOSPITAL_COMMUNITY): Payer: Self-pay

## 2024-07-03 MED ORDER — LEVOFLOXACIN 500 MG PO TABS
500.0000 mg | ORAL_TABLET | Freq: Every day | ORAL | 0 refills | Status: AC
Start: 2024-07-03 — End: 2024-07-11
  Filled 2024-07-03: qty 7, 7d supply, fill #0

## 2024-07-03 MED ORDER — GENTAMICIN SULFATE 0.1 % EX OINT
1.0000 | TOPICAL_OINTMENT | Freq: Every day | CUTANEOUS | 0 refills | Status: AC
  Filled 2024-07-03: qty 30, 30d supply, fill #0

## 2024-07-03 NOTE — Telephone Encounter (Signed)
 Patient responded after being contacted to review her nail culture and bacterial culture results.  The green nails were caused by a bacterial infection.  Will send in gentamicin  ointment as well as levofloxacin  oral medication to her pharmacy on file.  Once the green nail syndrome has been resolved then she would like to treat the nail fungus.

## 2024-07-03 NOTE — Telephone Encounter (Signed)
 Spoke to patient wanted to know if medication had been called in to pharmacy for fungal infection.

## 2024-07-04 ENCOUNTER — Other Ambulatory Visit (HOSPITAL_COMMUNITY): Payer: Self-pay

## 2024-07-04 ENCOUNTER — Other Ambulatory Visit: Payer: Self-pay

## 2024-07-05 ENCOUNTER — Ambulatory Visit: Payer: Self-pay

## 2024-07-05 ENCOUNTER — Ambulatory Visit: Admitting: Family

## 2024-07-05 ENCOUNTER — Other Ambulatory Visit (HOSPITAL_COMMUNITY): Payer: Self-pay

## 2024-07-05 ENCOUNTER — Encounter: Payer: Self-pay | Admitting: Family

## 2024-07-05 VITALS — BP 103/66 | HR 70 | Temp 97.3°F | Ht 62.0 in | Wt 168.6 lb

## 2024-07-05 DIAGNOSIS — M545 Low back pain, unspecified: Secondary | ICD-10-CM | POA: Diagnosis not present

## 2024-07-05 DIAGNOSIS — M791 Myalgia, unspecified site: Secondary | ICD-10-CM

## 2024-07-05 DIAGNOSIS — G8929 Other chronic pain: Secondary | ICD-10-CM

## 2024-07-05 DIAGNOSIS — R0602 Shortness of breath: Secondary | ICD-10-CM

## 2024-07-05 NOTE — Progress Notes (Signed)
 Patient ID: Nakeyia Menden, female    DOB: 09/12/1971, 53 y.o.   MRN: [id number]  Chief Complaint  Patient presents with   Leg Pain    Pt c/o bilateral thigh pain, present 3 days.    Back Pain    Pt c/o lower back pain, present for years. Pt does see sports med.    Shortness of Breath    Pt c/o SOB at rest.   Discussed the use of AI scribe software for clinical note transcription with the patient, who gave verbal consent to proceed.  History of Present Illness   Morelia Cassells is a 53 year old female who presents with muscle aches and back pain.  Myalgias - Muscle aches and pains, particularly on the top of the thighs - Onset a few days ago - Pain is achy and persistent - No tingling, numbness, stabbing, or burning sensations - No recent changes in physical activity due to a broken toe - Currently taking a statin since January at a dose of 10 mg daily, with no dose increase  Lower back pain - Intermittent lower back pain - Pain flares with weight gain, especially with an increase of 10 to 15 pounds - Pain associated with prolonged periods of driving - Uncertain if back pain has worsened since discontinuing methotrexate  three weeks ago  Dyspnea - Shortness of breath described as inability to take a complete breath - No associated chest tightness, pain, cough/nocturnal cough or wheezing - No history of asthma - Symptoms began after the birth of her last child - Symptoms started after moving to the area and possibly coincide with allergy season  Autoimmune disease management - History of autoimmune illness previously managed with methotrexate  for five years - Methotrexate  discontinued three weeks ago - Methotrexate  was initially prescribed for scleritis and uveitis with symptoms of eye redness and photophobia  Current medications - Currently taking Levaquin  (started yesterday), and gentamicin      Assessment and Plan    Myalgia possibly related to statin use Persistent thigh  myalgia possibly linked to rosuvastatin  use. Late onset myalgia is possible despite typical early occurrence. - Order CK & CRP labs to assess muscle damage. - Consider reducing rosuvastatin  dose to 5mg  or switching to Livalo or pravastatin if CK is elevated or pain is persistent.  Lumbar pain Currently receiving care via Sports med provider. Thinks her frequent driving during the day and weight gain are contributing. - Continue to follow POC with sports med. - Try OTC Lidocaine  patches prn  Shortness of breath, likely allergic etiology Intermittent shortness of breath likely due to current allergy season, possibly exacerbated by ragweed exposure. - Consider albuterol  inhaler if shortness of breath worsens. - Advise wearing a mask when outside to reduce allergen exposure.     Subjective:    Outpatient Medications Prior to Visit  Medication Sig Dispense Refill   folic acid  (FOLVITE ) 1 MG tablet Take 5 tablets (5 mg total) by mouth daily. 450 tablet 5   gentamicin  ointment (GARAMYCIN ) 0.1 % Apply a thin layer (1 application) to affected toenails and under front edge of toenails once daily. 30 g 0   levofloxacin  (LEVAQUIN ) 500 MG tablet Take 1 tablet (500 mg total) by mouth daily for 7 days. 7 tablet 0   pantoprazole  (PROTONIX ) 40 MG tablet Take 1 tablet (40 mg total) by mouth daily. 90 tablet 1   rosuvastatin  (CRESTOR ) 20 MG tablet Take 1 tablet (20 mg total) by mouth daily. 90 tablet 3  amoxicillin -clavulanate (AUGMENTIN ) 875-125 MG tablet Take 1 tablet by mouth 2 (two) times daily for 10 days 20 tablet 2   amoxicillin -clavulanate (AUGMENTIN ) 875-125 MG tablet Take 1 tablet by mouth 2 (two) times daily. 20 tablet 0   folic acid  (FOLVITE ) 1 MG tablet Take 5 tablets (5 mg total) by mouth daily. 450 tablet 5   loteprednol  (LOTEMAX ) 0.5 % ophthalmic suspension Place 1 drop into both eyes 4 (four) times daily. 15 mL 11   methotrexate  (RHEUMATREX) 2.5 MG tablet Take 6 tablets (15 mg total) by  mouth once a week. 48 tablet 3   methotrexate  (RHEUMATREX) 2.5 MG tablet Take by mouth.     ondansetron  (ZOFRAN -ODT) 4 MG disintegrating tablet Take 1 tablet (4 mg total) by mouth every 6 (six) hours as needed 20 tablet 1   ondansetron  (ZOFRAN -ODT) 4 MG disintegrating tablet Dissolve 1 tablet (4 mg total) in mouth every 8 (eight) hours as needed. 30 tablet 0   polyethylene glycol (GOLYTELY ) 236 g solution as directed 4000 mL 0   valACYclovir  (VALTREX ) 500 MG tablet Take 1 tablet (500 mg total) by mouth daily as prevention for 2 (two) times daily for 3 days with symptoms. 90 tablet 0   No facility-administered medications prior to visit.   Past Medical History:  Diagnosis Date   Chronic cholecystitis    GERD (gastroesophageal reflux disease)    Tums prn, worse in pregnancy   HSV infection    Has never had an outbreak on valtrex , fever blister   Miscarriage    Preglaucoma, unspecified    elevated IOP/  right eye   Scleritis    on methotrexate    Unspecified iridocyclitis    right eye   Uveitis    Past Surgical History:  Procedure Laterality Date   ANAL FISSURE REPAIR  AGE 35   CATARACT EXTRACTION W/ INTRAOCULAR LENS IMPLANT Right APRIL 2014   CESAREAN SECTION N/A 04/22/2016   Procedure: CESAREAN SECTION;  Surgeon: Dickie Carder, MD;  [facility];  Service: Obstetrics;  Laterality: N/A;   CHOLECYSTECTOMY N/A 09/05/2017   Procedure: LAPAROSCOPIC CHOLECYSTECTOMY;  Surgeon: Vernetta Berg, MD;  Location: [facility];  Service: General;  Laterality: N/A;   DILATION AND EVACUATION N/A 08/21/2013   miscarriage   No Known Allergies    Objective:    Physical Exam Vitals and nursing note reviewed.  Constitutional:      Appearance: Normal appearance.  Cardiovascular:     Rate and Rhythm: Normal rate and regular rhythm.  Pulmonary:     Effort: Pulmonary effort is normal.     Breath sounds: Normal breath sounds.  Musculoskeletal:        General:  Normal range of motion.  Skin:    General: Skin is warm and dry.  Neurological:     Mental Status: She is alert.  Psychiatric:        Mood and Affect: Mood normal.        Behavior: Behavior normal.    BP 103/66 (BP Location: Left Arm, Patient Position: Sitting, Cuff Size: Large)   Pulse 70   Temp (!) 97.3 F (36.3 C) (Temporal)   Ht 5' 2 (1.575 m)   Wt 168 lb 9.6 oz (76.5 kg)   SpO2 97%   BMI 30.84 kg/m  Wt Readings from Last 3 Encounters:  07/05/24 168 lb 9.6 oz (76.5 kg)  07/12/23 157 lb 6 oz (71.4 kg)  03/24/22 147 lb (66.7 kg)  Lucius Krabbe, NP

## 2024-07-05 NOTE — Telephone Encounter (Signed)
 FYI Only or Action Required?: FYI only for provider.  Patient was last seen in primary care on 07/12/2023 by Katrinka Garnette KIDD, MD.  Called Nurse Triage reporting Muscle Pain and Leg Pain.  Symptoms began several days ago.  Interventions attempted: Other: stopped Crestor  (and was only taking 10mg ).  Symptoms are: lower back pain, SOB yesterday (resolved), bilateral thigh/quad severe pain gradually worsening.  Triage Disposition: See PCP When Office is Open (Within 3 Days)  Patient/caregiver understands and will follow disposition?: Yes            Copied from CRM #[id number]. Topic: Clinical - Red Word Triage >> Jul 05, 2024  8:07 AM Larissa RAMAN wrote: [facility] that prompted transfer to Nurse Triage: muscle pain, b/l leg pain Reason for Disposition  [1] MODERATE pain (e.g., interferes with normal activities, limping) AND [2] present > 3 days  Answer Assessment - Initial Assessment Questions 1. ONSET: When did the pain start?      2-3 days ago; worsening yesterday and today.  2. LOCATION: Where is the pain located?      Both legs; she states it feels like more in her muscles of her upper leg (quad muscle).  3. PAIN: How bad is the pain?    (Scale 1-10; or mild, moderate, severe)     10/10.  4. WORK OR EXERCISE: Has there been any recent work or exercise that involved this part of the body?      No. She states she has not exercised for a few months and denies any physical injury.  5. CAUSE: What do you think is causing the leg pain?     Unsure. She is worried it could be related to taking a statin.  6. OTHER SYMPTOMS: Do you have any other symptoms? (e.g., chest pain, back pain, breathing difficulty, swelling, rash, fever, numbness, weakness)     Lower back pain x years (worse when she gains weight; worse the past 2-3 days); yesterday she reports SOB/difficulty breathing and describes it as having to take a deep breath. Reports legs feel weak from the pain. Denies  any SOB or difficulty breathing now. Denies swelling, rash, chest pain, calf pain, fever, numbness. Reports urine appears normal, yellow.  7. PREGNANCY: Is there any chance you are pregnant? When was your last menstrual period?     N/A.  Patient asked if Dr Katrinka would order a CK lab draw without seeing her. Advised patient to schedule acute visit with a provider. She wanted to notate in her chart that she has only been taking 10mg  of her statin medication and has stopped it since yesterday.  Protocols used: Leg Pain-A-AH

## 2024-07-05 NOTE — Telephone Encounter (Signed)
 Patient scheduled to see Corean 9/18. Dr. Katrinka will review triage notes.

## 2024-07-06 LAB — CK: Total CK: 169 U/L (ref 17–177)

## 2024-07-06 LAB — C-REACTIVE PROTEIN: CRP: <1 mg/dL (ref 0.5–20.0)

## 2024-07-08 ENCOUNTER — Ambulatory Visit: Payer: Self-pay | Admitting: Family

## 2024-07-08 MED ORDER — ROSUVASTATIN CALCIUM 20 MG PO TABS: 10.0000 mg | ORAL_TABLET | Freq: Every day | ORAL | Status: AC

## 2024-07-09 ENCOUNTER — Other Ambulatory Visit (HOSPITAL_COMMUNITY): Payer: Self-pay

## 2024-07-16 ENCOUNTER — Ambulatory Visit

## 2024-07-16 ENCOUNTER — Ambulatory Visit (INDEPENDENT_AMBULATORY_CARE_PROVIDER_SITE_OTHER): Payer: 59 | Admitting: Family Medicine

## 2024-07-16 ENCOUNTER — Ambulatory Visit: Payer: Self-pay | Admitting: Family Medicine

## 2024-07-16 ENCOUNTER — Encounter: Payer: Self-pay | Admitting: Family Medicine

## 2024-07-16 VITALS — BP 108/72 | Temp 97.4°F | Ht 62.0 in | Wt 169.2 lb

## 2024-07-16 DIAGNOSIS — Z77090 Contact with and (suspected) exposure to asbestos: Secondary | ICD-10-CM

## 2024-07-16 DIAGNOSIS — R300 Dysuria: Secondary | ICD-10-CM | POA: Diagnosis not present

## 2024-07-16 DIAGNOSIS — Z1283 Encounter for screening for malignant neoplasm of skin: Secondary | ICD-10-CM

## 2024-07-16 DIAGNOSIS — E669 Obesity, unspecified: Secondary | ICD-10-CM

## 2024-07-16 DIAGNOSIS — Z131 Encounter for screening for diabetes mellitus: Secondary | ICD-10-CM

## 2024-07-16 DIAGNOSIS — Z801 Family history of malignant neoplasm of trachea, bronchus and lung: Secondary | ICD-10-CM | POA: Diagnosis not present

## 2024-07-16 DIAGNOSIS — E785 Hyperlipidemia, unspecified: Secondary | ICD-10-CM | POA: Diagnosis not present

## 2024-07-16 DIAGNOSIS — Z0000 Encounter for general adult medical examination without abnormal findings: Secondary | ICD-10-CM

## 2024-07-16 LAB — CBC WITH DIFFERENTIAL/PLATELET
Basophils Absolute: 0 K/uL (ref 0.0–0.1)
Basophils Relative: 0.7 % (ref 0.0–3.0)
Eosinophils Absolute: 0.2 K/uL (ref 0.0–0.7)
Eosinophils Relative: 2.7 % (ref 0.0–5.0)
HCT: 40.3 % (ref 36.0–46.0)
Hemoglobin: 13.4 g/dL (ref 12.0–15.0)
Lymphocytes Relative: 30.4 % (ref 12.0–46.0)
Lymphs Abs: 1.8 K/uL (ref 0.7–4.0)
MCHC: 33.3 g/dL (ref 30.0–36.0)
MCV: 88.8 fl (ref 78.0–100.0)
Monocytes Absolute: 0.4 K/uL (ref 0.1–1.0)
Monocytes Relative: 6.5 % (ref 3.0–12.0)
Neutro Abs: 3.6 K/uL (ref 1.4–7.7)
Neutrophils Relative %: 59.7 % (ref 43.0–77.0)
Platelets: 235 K/uL (ref 150.0–400.0)
RBC: 4.54 Mil/uL (ref 3.87–5.11)
RDW: 13.4 % (ref 11.5–15.5)
WBC: 6 K/uL (ref 4.0–10.5)

## 2024-07-16 LAB — COMPREHENSIVE METABOLIC PANEL WITH GFR
ALT: 32 U/L (ref 0–35)
AST: 22 U/L (ref 0–37)
Albumin: 4.7 g/dL (ref 3.5–5.2)
Alkaline Phosphatase: 57 U/L (ref 39–117)
BUN: 17 mg/dL (ref 6–23)
CO2: 28 meq/L (ref 19–32)
Calcium: 9.7 mg/dL (ref 8.4–10.5)
Chloride: 104 meq/L (ref 96–112)
Creatinine, Ser: 0.66 mg/dL (ref 0.40–1.20)
GFR: 101.67 mL/min (ref >60.00)
Glucose, Bld: 86 mg/dL (ref 70–99)
Potassium: 4.2 meq/L (ref 3.5–5.1)
Sodium: 139 meq/L (ref 135–145)
Total Bilirubin: 0.5 mg/dL (ref 0.2–1.2)
Total Protein: 7.5 g/dL (ref 6.0–8.3)

## 2024-07-16 LAB — LIPID PANEL
Cholesterol: 221 mg/dL — ABNORMAL HIGH (ref 0–200)
HDL: 62.4 mg/dL (ref >39.00)
LDL Cholesterol: 138 mg/dL — ABNORMAL HIGH (ref 0–99)
NonHDL: 158.77
Total CHOL/HDL Ratio: 4
Triglycerides: 106 mg/dL (ref 0.0–149.0)
VLDL: 21.2 mg/dL (ref 0.0–40.0)

## 2024-07-16 LAB — URINALYSIS, ROUTINE W REFLEX MICROSCOPIC
Bilirubin Urine: NEGATIVE
Ketones, ur: NEGATIVE
Nitrite: NEGATIVE
Specific Gravity, Urine: 1.01 (ref 1.000–1.030)
Total Protein, Urine: NEGATIVE
Urine Glucose: NEGATIVE
Urobilinogen, UA: 0.2 (ref 0.0–1.0)
pH: 6 (ref 5.0–8.0)

## 2024-07-16 LAB — HEMOGLOBIN A1C: Hgb A1c MFr Bld: 5.7 % (ref 4.6–6.5)

## 2024-07-16 NOTE — Patient Instructions (Addendum)
-   lets give this another 2 weeks off the statin- if continuing to improve maybe lets try 5 mg rosuvastatin - send me a message on this -if not substantially improved id consider seeing sports medicine Dr. Marquette back and investigating back further as potential source -may correspond to coming off methotrexate - so we need to rethink through that if not improving  We have placed a referral for you today to Dr. Alm- please call their # if you do not hear within a week (may be listed below or you may see mychart message within a few days with #).   X-ray before you go  Please stop by lab before you go If you have mychart- we will send your results within 3 business days of us  receiving them.  If you do not have mychart- we will call you about results within 5 business days of us  receiving them.  *please also note that you will see labs on mychart as soon as they post. I will later go in and write notes on them- will say notes from Dr. Katrinka   Recommended follow up: Return in about 1 year (around 07/16/2025) for physical or sooner if needed.Schedule b4 you leave.

## 2024-07-16 NOTE — Progress Notes (Signed)
 Phone [phone]   Subjective:  Patient presents today for their annual physical. Chief complaint-noted.   See problem oriented charting- ROS- full  review of systems was completed and negative except for topics noted under acute/chronic concerns   The following were reviewed and entered/updated in epic: Past Medical History:  Diagnosis Date   Chronic cholecystitis    GERD (gastroesophageal reflux disease)    Tums prn, worse in pregnancy   HSV infection    Has never had an outbreak on valtrex , fever blister   Miscarriage    Preglaucoma, unspecified    elevated IOP/  right eye   Scleritis    on methotrexate    Unspecified iridocyclitis    right eye   Uveitis    Patient Active Problem List   Diagnosis Date Noted   Uveitis     Priority: High   Benign paroxysmal positional vertigo 06/26/2020    Priority: Medium    Low back pain 06/26/2020    Priority: Low   Immunization, BCG 10/30/2019    Priority: Low   Family history of colon cancer 08/21/2015    Priority: Low   GERD (gastroesophageal reflux disease)     Priority: Low   S/P cataract extraction 02/02/2013    Priority: Low   Past Surgical History:  Procedure Laterality Date   ANAL FISSURE REPAIR  AGE 31   CATARACT EXTRACTION W/ INTRAOCULAR LENS IMPLANT Right APRIL 2014   CESAREAN SECTION N/A 04/22/2016   Procedure: CESAREAN SECTION;  Surgeon: Dickie Carder, MD;  Location: [facility];  Service: Obstetrics;  Laterality: N/A;   CHOLECYSTECTOMY N/A 09/05/2017   Procedure: LAPAROSCOPIC CHOLECYSTECTOMY;  Surgeon: Vernetta Berg, MD;  Location: [facility];  Service: General;  Laterality: N/A;   DILATION AND EVACUATION N/A 08/21/2013   miscarriage    Family History  Problem Relation Age of Onset   Diabetes Mother    Hypertension Mother    Diabetes Father    Hypertension Father    Cancer Father        Colon 29, lung- former smoker 80   Hyperlipidemia Father        mother     Medications- reviewed and updated Current Outpatient Medications  Medication Sig Dispense Refill   folic acid  (FOLVITE ) 1 MG tablet Take 5 tablets (5 mg total) by mouth daily. 450 tablet 5   gentamicin  ointment (GARAMYCIN ) 0.1 % Apply a thin layer (1 application) to affected toenails and under front edge of toenails once daily. 30 g 0   loteprednol  (LOTEMAX ) 0.5 % ophthalmic suspension Place 1 drop into both eyes 4 (four) times daily. 15 mL 11   ondansetron  (ZOFRAN -ODT) 4 MG disintegrating tablet Dissolve 1 tablet (4 mg total) in mouth every 8 (eight) hours as needed. 30 tablet 0   pantoprazole  (PROTONIX ) 40 MG tablet Take 1 tablet (40 mg total) by mouth daily. 90 tablet 1   polyethylene glycol (GOLYTELY ) 236 g solution as directed 4000 mL 0   valACYclovir  (VALTREX ) 500 MG tablet Take 1 tablet (500 mg total) by mouth daily as prevention for 2 (two) times daily for 3 days with symptoms. 90 tablet 0   rosuvastatin  (CRESTOR ) 20 MG tablet Take 0.5 tablets (10 mg total) by mouth daily. (Patient not taking: Reported on 07/16/2024)     No current facility-administered medications for this visit.    Allergies-reviewed and updated No Known Allergies  Social History   Social History Narrative   Married. 3 children- [name] (sounds like isla)  and  Jawna- daughters. 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 Goes for walks with kids. Enjoys shopping/spending money.          Objective  Objective:  BP 108/72 (BP Location: Left Arm, Patient Position: Sitting, Cuff Size: Normal)   Temp (!) 97.4 F (36.3 C) (Temporal)   Ht 5' 2 (1.575 m)   Wt 169 lb 3.2 oz (76.7 kg)   BMI 30.95 kg/m  Gen: NAD, resting comfortably HEENT: Mucous membranes are moist. Oropharynx normal. Tympanic membrane normal on right, scar on  left- long term Neck: no thyromegaly CV: RRR no murmurs rubs or gallops Lungs: CTAB no crackles, wheeze, rhonchi Abdomen: soft/nontender/nondistended/normal bowel sounds. No rebound or guarding.  Ext: no edema Skin: warm, dry Neuro: grossly normal, moves all extremities, PERRLA Good leg strength, negative straight leg raise, no midline pain with palpation of back   Assessment and Plan   53 y.o. female presenting for annual physical.  Health Maintenance counseling: 1. Anticipatory guidance: Patient counseled regarding regular dental exams -q3 months, eye exams-very close follow-up with uveitis history questions 4 months,  avoiding smoking and second hand smoke , limiting alcohol to 1 beverage per day- doesn't drink , no illicit drugs .   2. Risk factor reduction:  Advised patient of need for regular exercise and diet rich and fruits and vegetables to reduce risk of heart attack and stroke.  Exercise-continues to be a challenge with work schedule and caring for her daughters.  Recent setback with leg pain as well- getting back in gym.  Diet/weight management-was able to lose weight in the past with optivia and got down 33 pounds from peak weight about 2 years ago but had trended up 10 pounds last year and another 11 pounds this year.  Has started working on this - vacation was a barrier but is turning this around already!  Wt Readings from Last 3 Encounters:  07/16/24 169 lb 3.2 oz (76.7 kg)  07/05/24 168 lb 9.6 oz (76.5 kg)  07/12/23 157 lb 6 oz (71.4 kg)  3. Immunizations/screenings/ancillary studies-Shingrix- later date when legs better, plan flu shot with work, wants to hold off on COVID and [employer] History  Administered Date(s) Administered   Influenza Whole 09/18/2019   Influenza, Seasonal, Injecte, Preservative Fre 07/12/2023   Influenza,inj,Quad PF,6+ Mos 11/28/2018, 08/05/2021   Influenza-Unspecified 07/28/2015, 08/18/2016   PFIZER(Purple Top)SARS-COV-2 Vaccination  11/13/2019, 12/04/2019, 06/08/2020   Tdap 08/10/2012, 07/12/2023  4. Cervical cancer screening-Pap 02/18/2023 and HPV negative so good for 5 years 5. Breast cancer screening-  breast exam with GYN and mammogram 02/09/23 on file but reports had one this year as well- reports february  6. Colon cancer screening -08/09/2023 with Dr. Kristie.  Dad died of colon cancer with 5-year follow-up  7. Skin cancer screening- has seen dermatology in the past- no recent issues- may schedule an update. advised regular sunscreen use. Denies worrisome, changing, or new skin lesions.  8. Birth control/STD check- IUD in place- plans for 7 years and monogamous   9. Osteoporosis screening at 58- consider later  perhaps after IUD removal 10. Smoking associated screening - never smoker  Status of chronic or acute concerns   # Bilateral leg pain with recent visit from Corean Haven, MD-patient presented  with 3 days of bilateral thigh pain starting in September along with some lower back pain present for years and she has seen sports medicine in the past for this.  She had been on a statin since January.some weakness as well.  -Also noted some shortness of breath described as inability to take a complete breath with no chest pain or tightness starting sometime after the birth of her last child - CK and CRP were not elevated.  They noted trying 5 mg of rosuvastatin  or trying Livalo or pravastatin - For her back pain thought frequent driving could be contributing and they were going to try over-the-counter lidocaine  patches and as needed sports medicine follow-up - Shortness of breath not related to allergic exposures and she had albuterol  available if needed -pain was 9/10 and yesterday at worst up to 7-8/10. Worse with walking and sometimes sitting. Better with laying. Advil  helps some. Back pain on on other hand worse at night and while laying down. Lumbar spine films 06/26/20- mild arthritis only. 2019 x-ray mild  scoliosis as well.  -did peripheral arterial disease test at home and negative Plan from today: - lets give this another 2 weeks off the statin- if continuing to improve maybe lets try 5 mg rosuvastatin - send me a message on this -if not substantially improved id consider seeing sports medicine Dr. Marquette back and investigating back further as potential source -considered prednisone  as well  #hyperlipidemia S: Medication: None-she is trying off of rosuvastatin  10 mg daily to see if it helps with her leg pain Lab Results  Component Value Date   CHOL 237 (H) 07/12/2023   HDL 62.50 07/12/2023   LDLCALC 157 (H) 07/12/2023   TRIG 89.0 07/12/2023   CHOLHDL 4 07/12/2023   A/P: update lipids but suspect high as has to be off statin right now due to pain   # Uveitis-continues to work with Dr. Versie stopped methotrexate - coming off this possibly correspondes to leg pain issues but reports prior extensive autoimmune workup unrevealing   # GERD- has not needed pantoprazole  lately. If takes advil  does take Pepcid  tobe proactive  Recommended follow up: Return in about 1 year (around 07/16/2025) for physical or sooner if needed.Schedule b4 you leave.  Lab/Order associations: fasting   ICD-10-CM   1. Preventative health care  Z00.00     2. Hyperlipidemia, unspecified hyperlipidemia type  E78.5 Comprehensive metabolic panel with GFR    CBC with Differential/Platelet    Lipid panel    3. Screening for diabetes mellitus  Z13.1 Hemoglobin A1c    4. Dysuria  R30.0 Urinalysis, Routine w reflex microscopic    Urine Culture    5. Obesity (BMI 30-39.9)  E66.9 Hemoglobin A1c    6. Screening exam for skin cancer  Z12.83 Ambulatory referral to Dermatology    7. Family history of lung cancer  Z80.1 DG Chest 2 View    8. Asbestos exposure  Z77.090 DG Chest 2 View      No orders of the defined types were placed in this encounter.   Return precautions advised.  Garnette Lukes, MD

## 2024-07-17 LAB — URINE CULTURE
MICRO NUMBER:: 17029717
Result:: NO GROWTH
SPECIMEN QUALITY:: ADEQUATE

## 2024-07-18 ENCOUNTER — Other Ambulatory Visit: Payer: Self-pay | Admitting: Family Medicine

## 2024-07-18 DIAGNOSIS — R3129 Other microscopic hematuria: Secondary | ICD-10-CM

## 2024-07-20 ENCOUNTER — Other Ambulatory Visit: Payer: Self-pay | Admitting: Podiatry

## 2024-07-20 ENCOUNTER — Other Ambulatory Visit (HOSPITAL_COMMUNITY): Payer: Self-pay

## 2024-07-20 MED ORDER — ITRACONAZOLE 100 MG PO CAPS
200.0000 mg | ORAL_CAPSULE | Freq: Two times a day (BID) | ORAL | 0 refills | Status: AC
  Filled 2024-07-20: qty 84, 21d supply, fill #0

## 2024-07-20 NOTE — Progress Notes (Signed)
 Patient finished the treatment for the green nail syndrome and wanted to go ahead and have the antifungal sent in.  Sent in itraconazole to her pharmacy on file.

## 2024-07-23 ENCOUNTER — Other Ambulatory Visit (HOSPITAL_COMMUNITY): Payer: Self-pay

## 2024-07-30 ENCOUNTER — Other Ambulatory Visit (HOSPITAL_COMMUNITY): Payer: Self-pay

## 2024-07-30 DIAGNOSIS — R3129 Other microscopic hematuria: Secondary | ICD-10-CM | POA: Diagnosis not present

## 2024-07-30 DIAGNOSIS — R102 Pelvic and perineal pain unspecified side: Secondary | ICD-10-CM | POA: Diagnosis not present

## 2024-07-30 DIAGNOSIS — R1031 Right lower quadrant pain: Secondary | ICD-10-CM | POA: Diagnosis not present

## 2024-07-30 DIAGNOSIS — N952 Postmenopausal atrophic vaginitis: Secondary | ICD-10-CM | POA: Diagnosis not present

## 2024-07-30 MED ORDER — ESTRADIOL 0.01 % VA CREA
TOPICAL_CREAM | VAGINAL | 0 refills | Status: AC
  Filled 2024-07-30: qty 42.5, 90d supply, fill #0

## 2024-07-30 MED ORDER — VALACYCLOVIR HCL 500 MG PO TABS
500.0000 mg | ORAL_TABLET | Freq: Every day | ORAL | 0 refills | Status: AC
  Filled 2024-07-30: qty 90, 90d supply, fill #0

## 2024-08-13 DIAGNOSIS — R3129 Other microscopic hematuria: Secondary | ICD-10-CM | POA: Diagnosis not present

## 2024-08-21 DIAGNOSIS — M9901 Segmental and somatic dysfunction of cervical region: Secondary | ICD-10-CM | POA: Diagnosis not present

## 2024-08-21 DIAGNOSIS — R519 Headache, unspecified: Secondary | ICD-10-CM | POA: Diagnosis not present

## 2024-08-21 DIAGNOSIS — M542 Cervicalgia: Secondary | ICD-10-CM | POA: Diagnosis not present

## 2024-08-21 DIAGNOSIS — M7918 Myalgia, other site: Secondary | ICD-10-CM | POA: Diagnosis not present

## 2024-08-21 DIAGNOSIS — M9902 Segmental and somatic dysfunction of thoracic region: Secondary | ICD-10-CM | POA: Diagnosis not present

## 2024-08-21 DIAGNOSIS — R42 Dizziness and giddiness: Secondary | ICD-10-CM | POA: Diagnosis not present

## 2024-09-05 DIAGNOSIS — Z1231 Encounter for screening mammogram for malignant neoplasm of breast: Secondary | ICD-10-CM | POA: Diagnosis not present

## 2024-09-05 DIAGNOSIS — Z1331 Encounter for screening for depression: Secondary | ICD-10-CM | POA: Diagnosis not present

## 2024-09-05 DIAGNOSIS — Z01419 Encounter for gynecological examination (general) (routine) without abnormal findings: Secondary | ICD-10-CM | POA: Diagnosis not present

## 2024-09-05 DIAGNOSIS — N952 Postmenopausal atrophic vaginitis: Secondary | ICD-10-CM | POA: Diagnosis not present

## 2024-09-05 DIAGNOSIS — R3129 Other microscopic hematuria: Secondary | ICD-10-CM | POA: Diagnosis not present

## 2024-09-05 DIAGNOSIS — Z01411 Encounter for gynecological examination (general) (routine) with abnormal findings: Secondary | ICD-10-CM | POA: Diagnosis not present

## 2024-09-05 DIAGNOSIS — N951 Menopausal and female climacteric states: Secondary | ICD-10-CM | POA: Diagnosis not present

## 2024-09-05 DIAGNOSIS — Z30432 Encounter for removal of intrauterine contraceptive device: Secondary | ICD-10-CM | POA: Diagnosis not present

## 2024-09-05 LAB — HM MAMMOGRAPHY

## 2024-09-17 DIAGNOSIS — N951 Menopausal and female climacteric states: Secondary | ICD-10-CM | POA: Diagnosis not present

## 2024-10-23 ENCOUNTER — Encounter (HOSPITAL_COMMUNITY): Payer: Self-pay

## 2024-10-23 ENCOUNTER — Other Ambulatory Visit (HOSPITAL_COMMUNITY): Payer: Self-pay

## 2024-10-23 MED ORDER — LOTEPREDNOL ETABONATE 0.5 % OP SUSP
1.0000 [drp] | Freq: Four times a day (QID) | OPHTHALMIC | 11 refills | Status: AC
  Filled 2024-10-23: qty 15, 75d supply, fill #0

## 2025-02-26 ENCOUNTER — Ambulatory Visit: Admitting: Dermatology

## 2025-07-22 ENCOUNTER — Encounter: Admitting: Family Medicine
